# Patient Record
Sex: Male | Born: 1940 | Race: White | Hispanic: No | Marital: Married | Smoking: Never smoker
Health system: Southern US, Community
[De-identification: ages and names within clinical notes are randomized; demographics above are authoritative.]

## PROBLEM LIST (undated history)

## (undated) DIAGNOSIS — K219 Gastro-esophageal reflux disease without esophagitis: Secondary | ICD-10-CM

## (undated) DIAGNOSIS — L111 Transient acantholytic dermatosis [Grover]: Secondary | ICD-10-CM

## (undated) DIAGNOSIS — N35919 Unspecified urethral stricture, male, unspecified site: Secondary | ICD-10-CM

## (undated) DIAGNOSIS — J189 Pneumonia, unspecified organism: Secondary | ICD-10-CM

## (undated) DIAGNOSIS — E785 Hyperlipidemia, unspecified: Secondary | ICD-10-CM

## (undated) DIAGNOSIS — N529 Male erectile dysfunction, unspecified: Secondary | ICD-10-CM

## (undated) HISTORY — PX: CYSTOURETHROSCOPY: SHX476

## (undated) HISTORY — PX: DESTRUCTION LESION PENILE: SUR414

## (undated) HISTORY — PX: CYSTOSCOPY WITH DIRECT VISION INTERNAL URETHROTOMY: SHX6637

## (undated) HISTORY — PX: VASECTOMY: SHX75

---

## 2005-06-10 ENCOUNTER — Ambulatory Visit: Payer: Self-pay | Admitting: Unknown Physician Specialty

## 2005-06-18 ENCOUNTER — Ambulatory Visit: Payer: Self-pay | Admitting: Unknown Physician Specialty

## 2005-07-13 ENCOUNTER — Ambulatory Visit: Payer: Self-pay | Admitting: Unknown Physician Specialty

## 2008-01-24 ENCOUNTER — Ambulatory Visit: Payer: Self-pay | Admitting: Nephrology

## 2009-03-18 IMAGING — US US RENAL KIDNEY
1 series · 14 of 25 positions shown · non-contrast
Comparison: none

REASON FOR EXAM: Hyperkalemia
COMMENTS:

[Series 1: us renal kidney · 0.31mm/px · 14 of 32 slices shown]
[im 1/32]
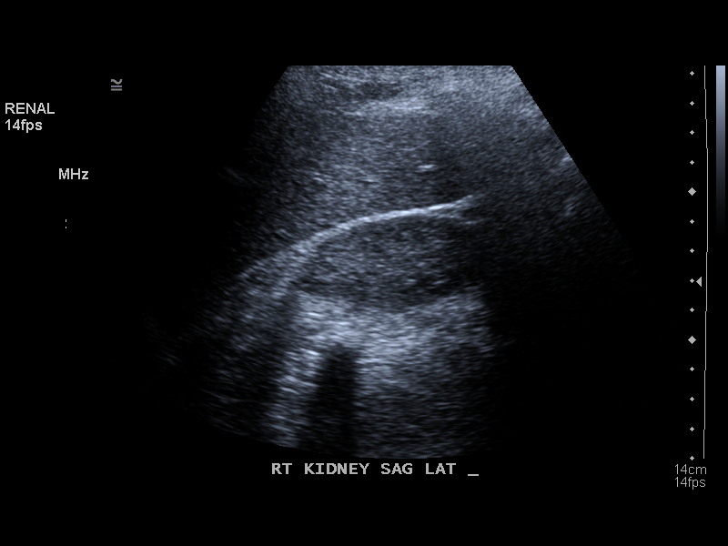
[im 3/32]
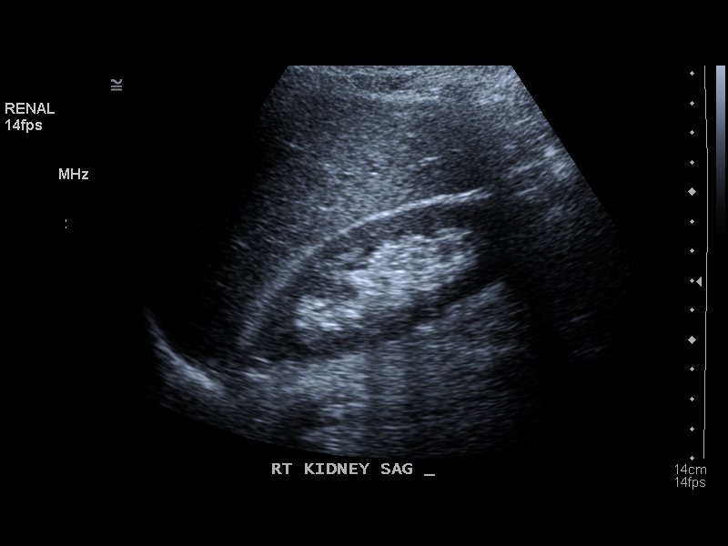
[im 6/32]
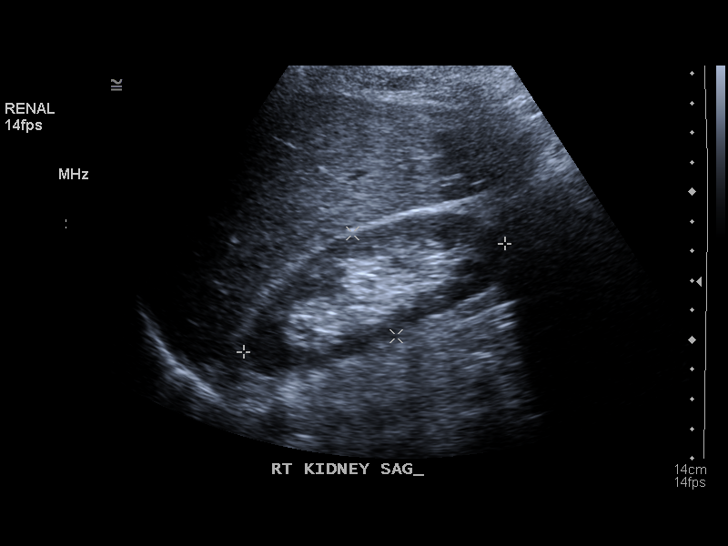
[im 8/32]
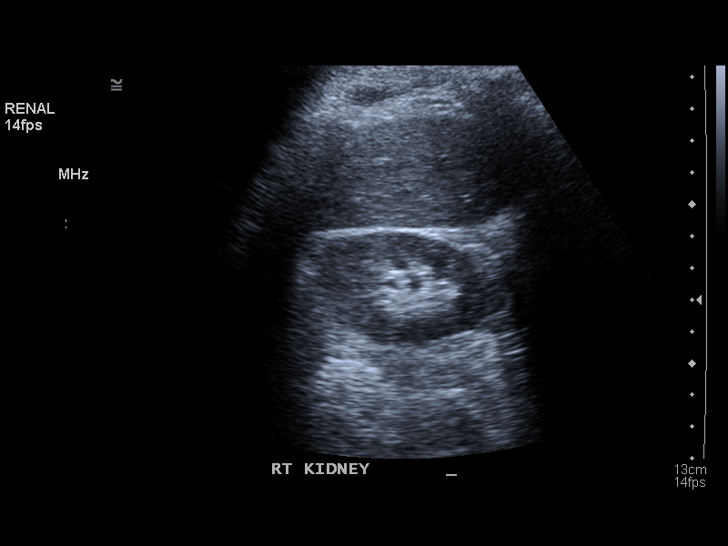
[im 11/32]
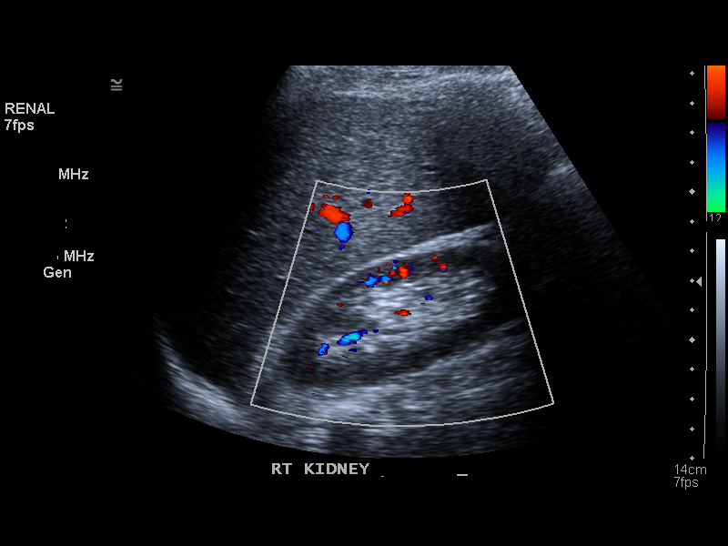
[im 12/32]
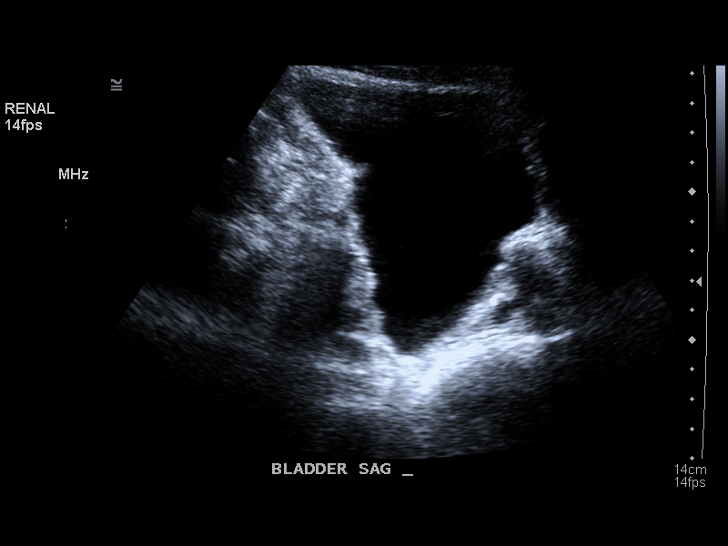
[im 15/32]
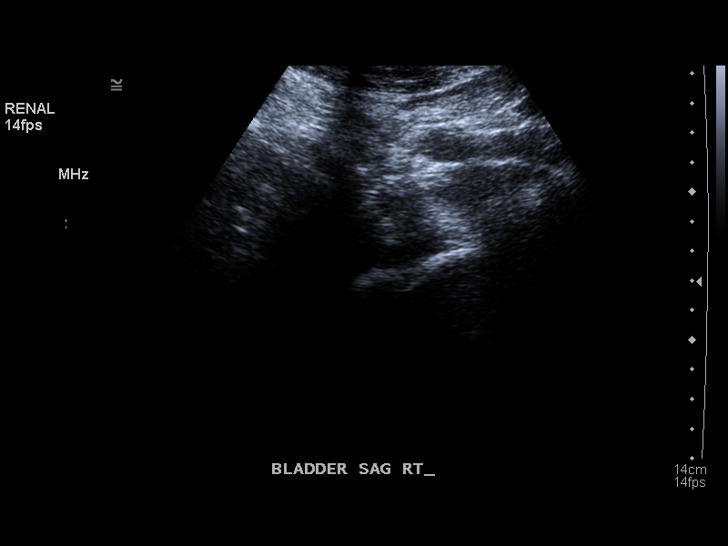
[im 17/32]
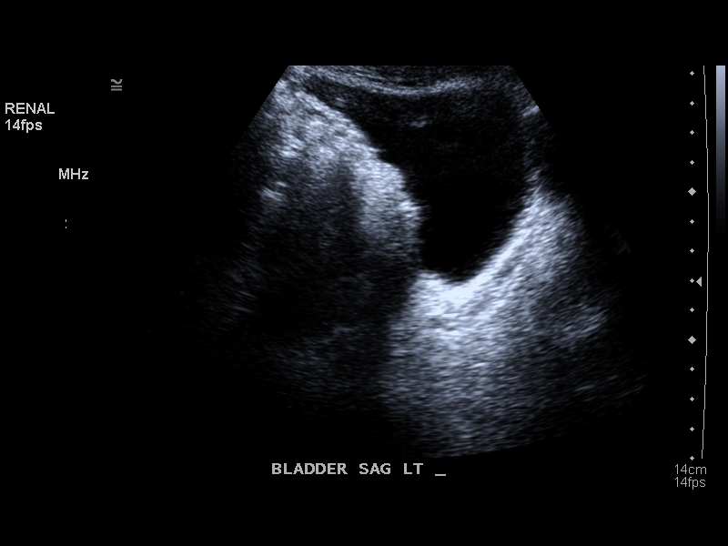
[im 20/32]
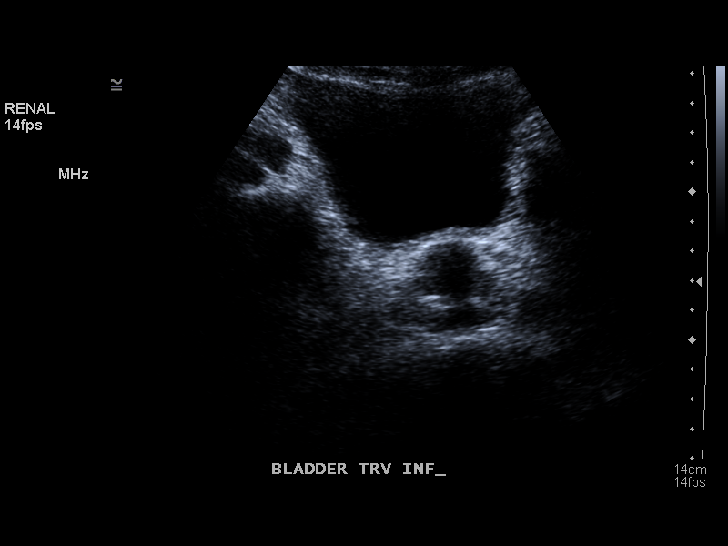
[im 21/32]
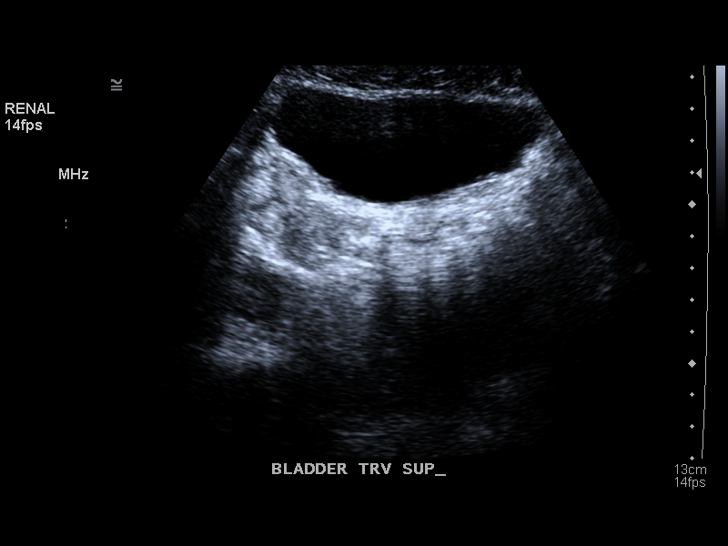
[im 24/32]
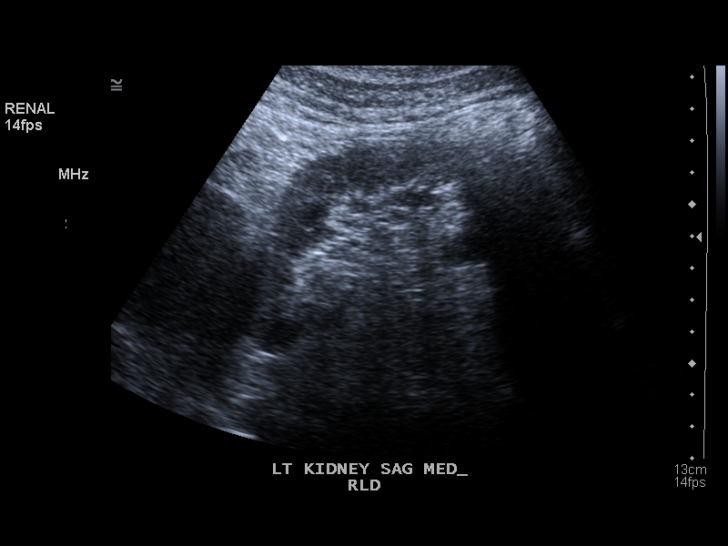
[im 26/32]
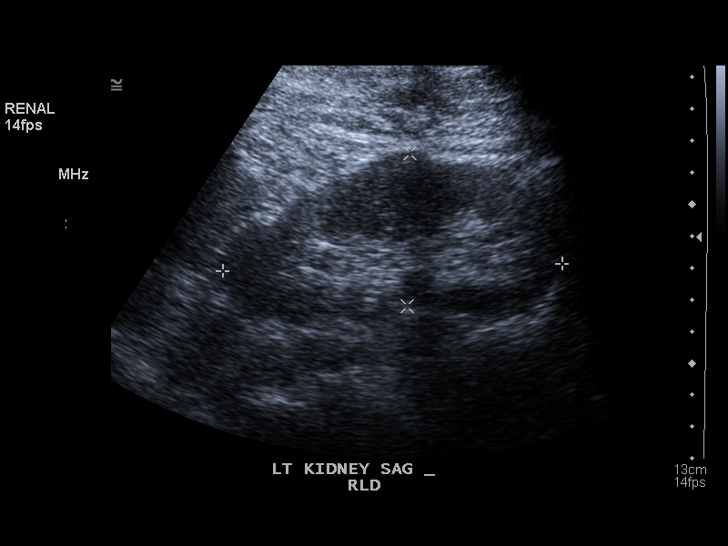
[im 29/32]
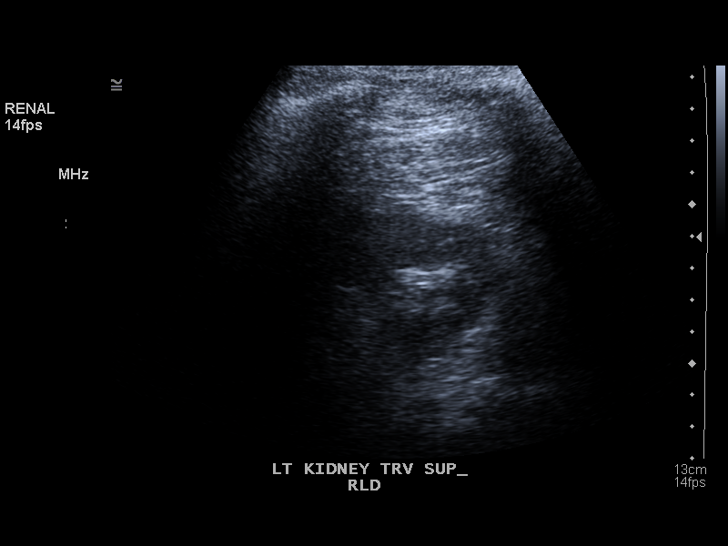
[im 32/32]
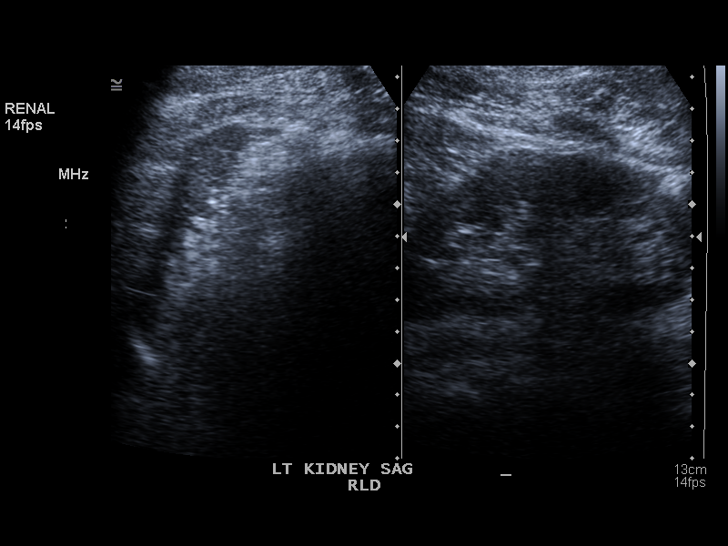

[14 of 25 positions shown; findings below may reference images not displayed]

PROCEDURE:     US  - US KIDNEY  - January 24, 2008  [DATE]

RESULT:     The RIGHT kidney measures 9.6 x 4.2 x 3.4 cm. The LEFT kidney
measures 10.7 x 3.9 x 4.8 cm. There is no hydronephrosis. I see no cystic or
solid mass. No perinephric fluid collection is seen. The urinary bladder is
moderately distended. Along its posterior surface some irregularity of the
mucosa appears present.
IMPRESSION: 1.  I see no acute abnormality of either kidney.
2.  There may be abnormality of the mucosa or submucosa of the posterior
wall of the urinary bladder. Cystoscopy is recommended.

## 2009-12-06 ENCOUNTER — Ambulatory Visit: Payer: Self-pay | Admitting: Urology

## 2011-01-29 IMAGING — US US RENAL KIDNEY
1 series · 17 of 25 positions shown · non-contrast
Comparison: none

REASON FOR EXAM: bladder mass    ATTN: bladder
COMMENTS:

[Series 1: us renal kidney · 17 of 38 slices shown]
[im 1/38]
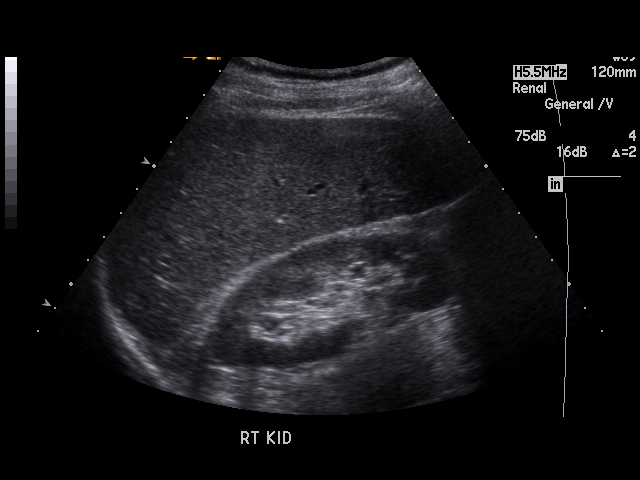
[im 4/38]
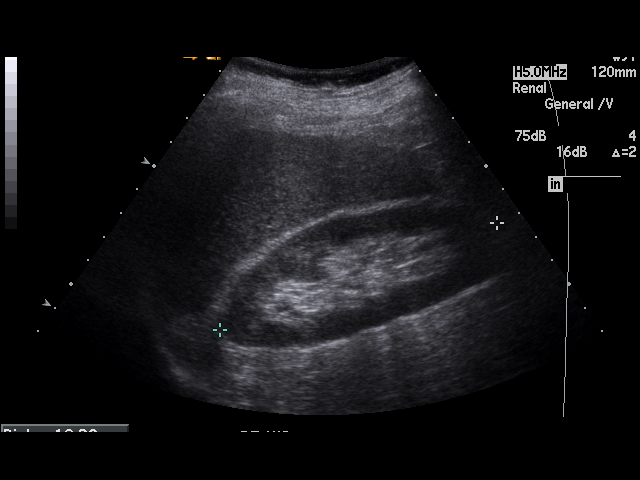
[im 5/38]
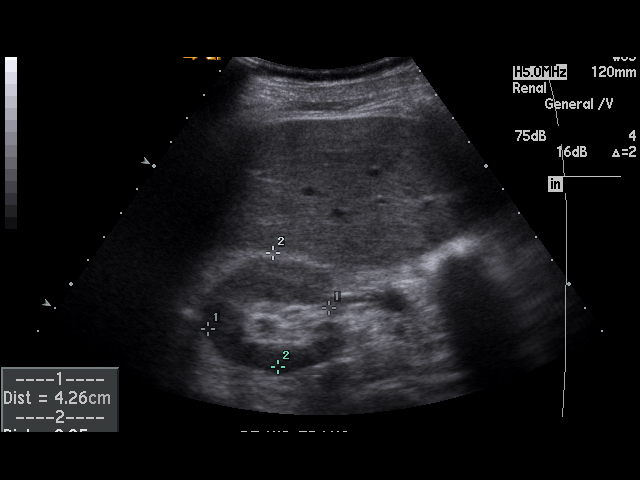
[im 8/38]
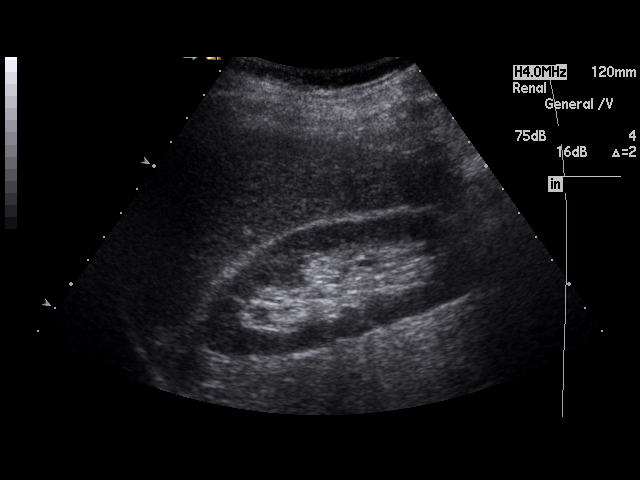
[im 10/38]
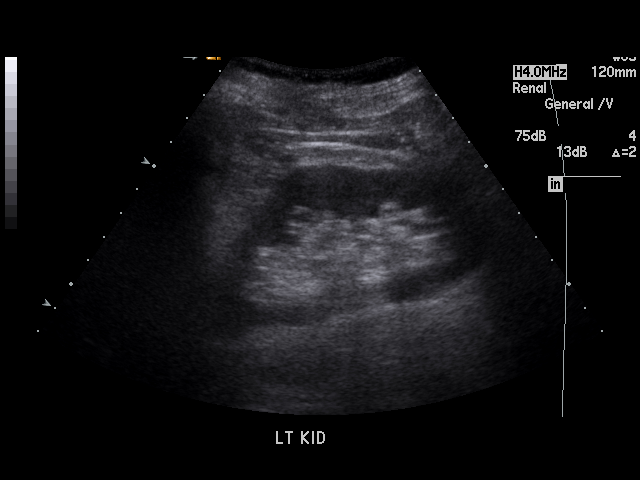
[im 13/38]
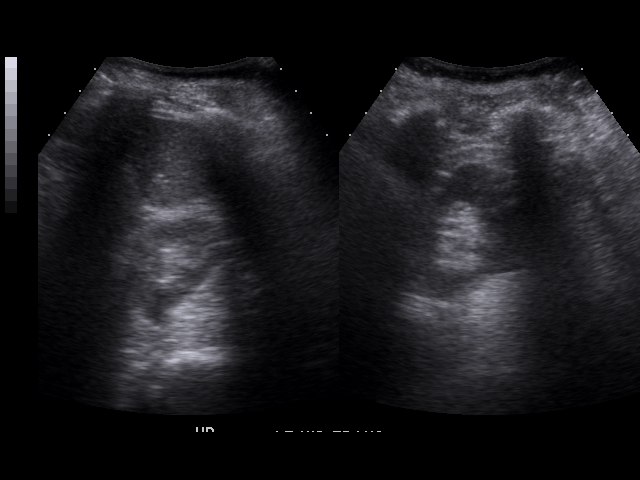
[im 14/38]
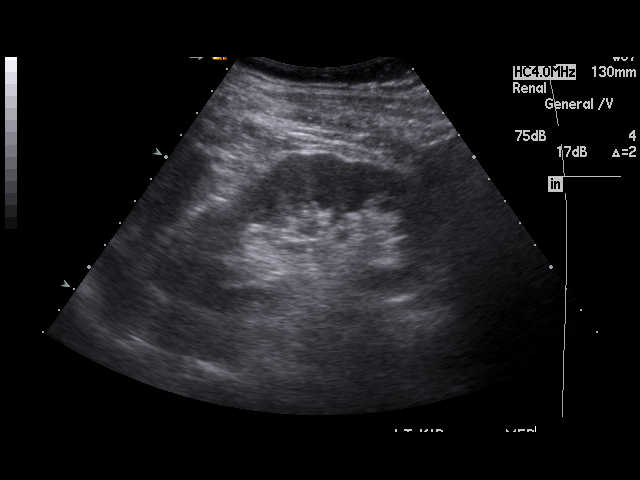
[im 17/38]
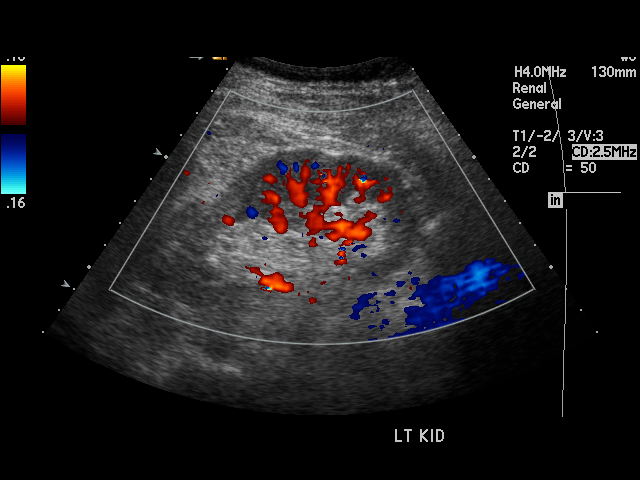
[im 19/38]
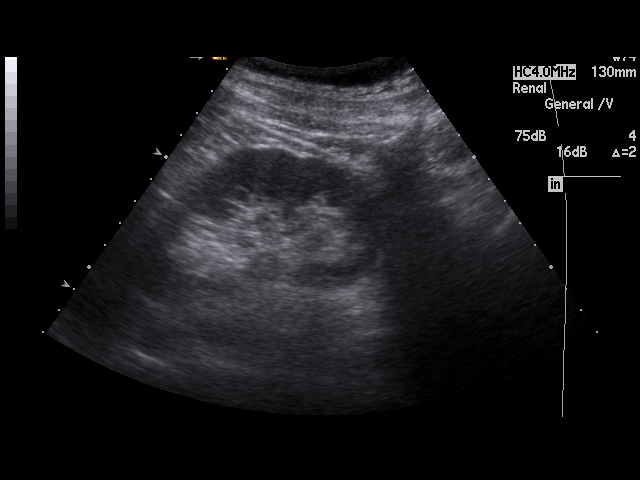
[im 21/38]
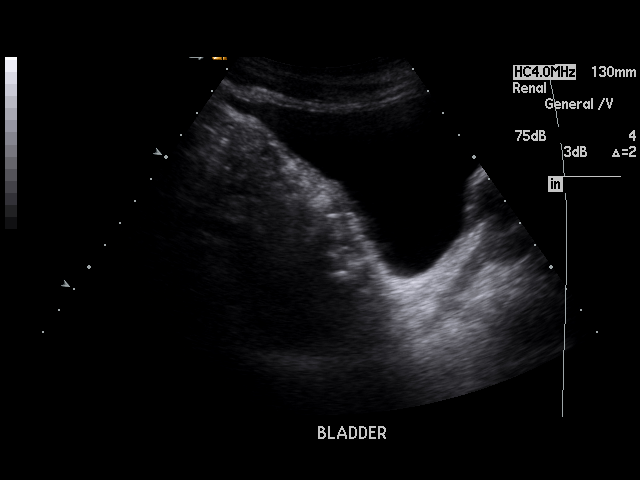
[im 24/38]
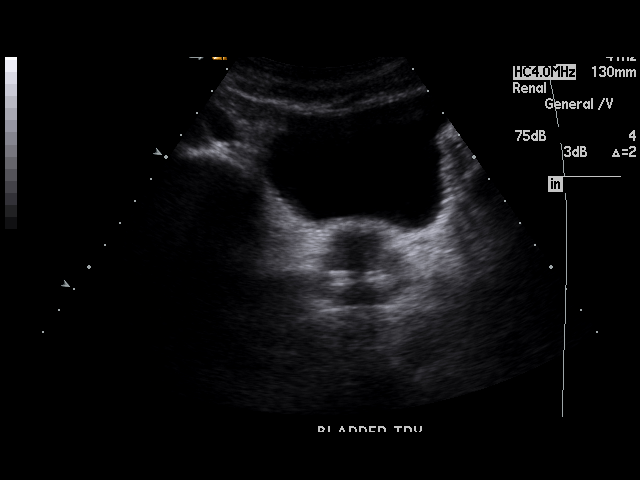
[im 25/38]
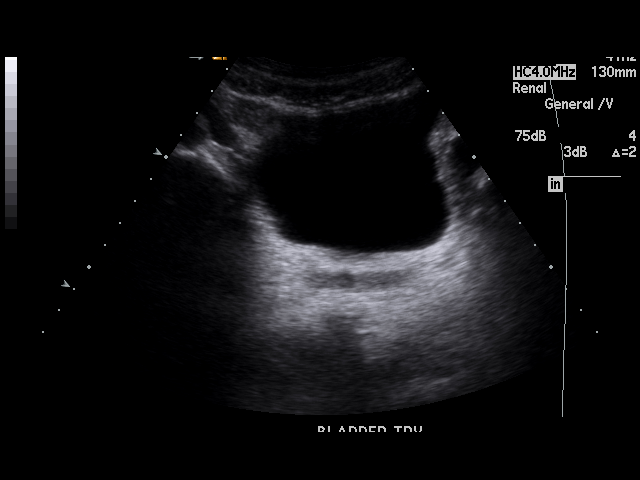
[im 28/38]
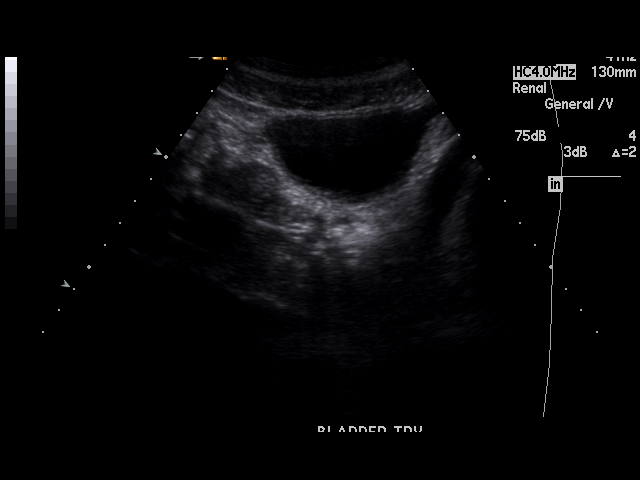
[im 30/38]
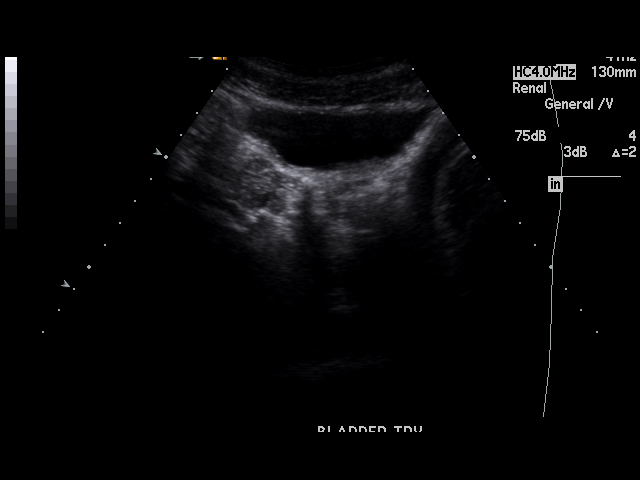
[im 33/38]
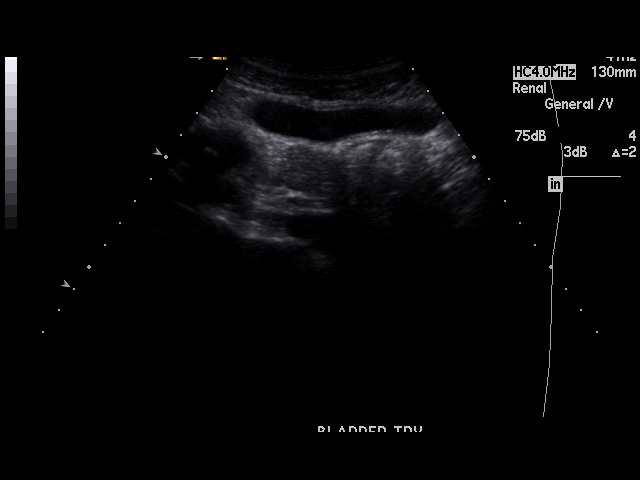
[im 34/38]
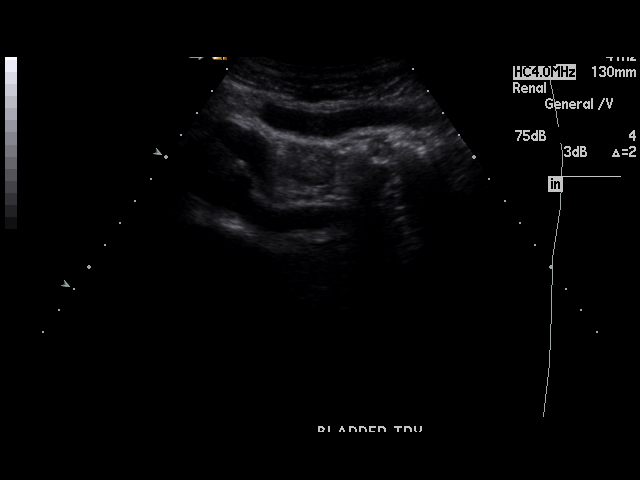
[im 38/38]
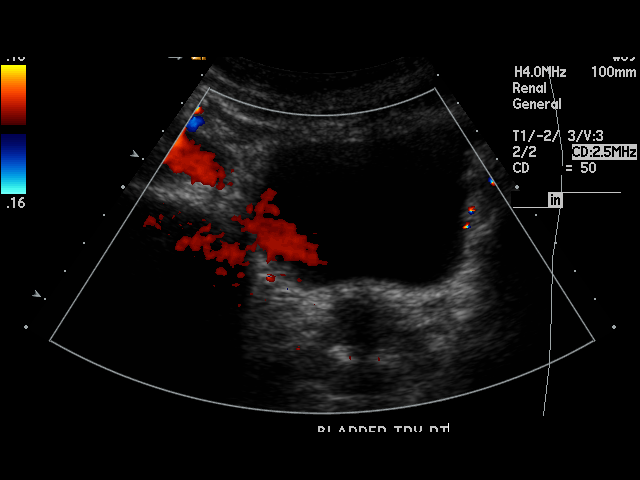

[17 of 25 positions shown; findings below may reference images not displayed]

PROCEDURE:     US  - US KIDNEY  - December 06, 2009  [DATE]

RESULT:     The right kidney measures 10.29 cm x 4.2 cm x 3.95 cm and the
left kidney measures 10.36 cm x 4.3 cm x 4.5 cm. The renal cortical margins
bilaterally are smooth. No renal mass lesions are seen. No renal
calcifications are noted. There is no hydronephrosis. The urinary bladder is
normal in appearance sonographically. The previously noted irregularity of
the posterior bladder wall observed on the exam of 01/24/2008 is no longer
seen. Bilateral ureteral flow jets are present.
IMPRESSION: 1.     No significant abnormalities are noted.

## 2013-01-03 ENCOUNTER — Ambulatory Visit: Payer: Self-pay | Admitting: Urology

## 2013-01-11 ENCOUNTER — Ambulatory Visit: Payer: Self-pay | Admitting: Urology

## 2015-03-22 NOTE — Op Note (Signed)
PATIENT NAME:  Brent Kim, Brent Kim MR#:  [id number] DATE OF BIRTH:  02/26/1941  DATE OF PROCEDURE:  01/11/2013  PREOPERATIVE DIAGNOSIS: Meatal stricture.   POSTOPERATIVE DIAGNOSES:  1. Meatal stricture.  2. Bulbar urethral stricture.   PROCEDURES: 1. Dilation of meatal stricture. 2. Visual internal urethrotomy.  3. Cystoscopy.   SURGEON: Solon Alban C. Lonna CobbStoioff, M.D.   ASSISTANT: None.   ANESTHESIA: General.   INDICATION: This is a 74 year old male with a history of urethral surgery greater than 20 years ago. He has recently noted progressive narrowing and spraying of his voiding stream with mild dysuria. He presents for cystoscopy and further stricture management.   DESCRIPTION OF PROCEDURE: The patient was taken to the operating room where a general anesthetic was administered. He was then placed in the low lithotomy position and his external genitalia were prepped and draped in the usual sterile fashion. The ureteral meatus would not accept a 17 French cystoscope. A pediatric flexible cystoscope was then passed per urethra. Meatal stricture was noted. The scope was advanced to where approximately a 10 French bulbar urethral stricture was noted. The cystoscope was removed. The meatal stricture was dilated with ureteral dilators from 12 to 20 [place]French without difficulty. A 20 French optical urethrotome with obturator was then lubricated and passed under direct vision. The dilated meatal stricture was traversed without problems. The scope was advanced down to the bulbar urethral stricture. The optical urethrotome was placed into the sheath. A guidewire was placed through the stricture and the stricture was incised in the 12 o'clock position. The urethrotome was then easily advanced past the stricture. The urethrotome was removed. A 21 French cystoscope with 30 degree lens was lubricated and passed under direct vision. The incised bulbar stricture was open. Prostate shows mild lateral lobe enlargement.  Bladder was closely inspected with 30 and 70 degree lenses. There was moderate bladder trabeculation present. The ureteral orifices were normal appearing with clear efflux. No bladder mucosal lesions were seen. The cystoscope was removed. A 20 French silicon catheter was placed without difficulty. The patient was taken to the PAC-U in stable condition. There were no complications. EBL minimal.  ____________________________ Verna CzechScott C. Lonna CobbStoioff, MD scs:sb D: 01/11/2013 10:00:48 ET T: 01/11/2013 10:18:01 ET JOB#: [id number]  cc: Lorin PicketScott C. Lonna CobbStoioff, MD, <Dictator> Riki AltesSCOTT C Belkis Norbeck MD ELECTRONICALLY SIGNED 01/18/2013 7:57

## 2015-07-12 ENCOUNTER — Encounter: Payer: Self-pay | Admitting: Endocrinology

## 2015-07-15 ENCOUNTER — Ambulatory Visit
Admission: RE | Admit: 2015-07-15 | Discharge: 2015-07-15 | Disposition: A | Discharge status: Home / Self Care | Payer: Medicare Other | Source: Ambulatory Visit | Attending: Unknown Physician Specialty | Admitting: Unknown Physician Specialty

## 2015-07-15 ENCOUNTER — Ambulatory Visit: Payer: Medicare Other | Admitting: Anesthesiology

## 2015-07-15 ENCOUNTER — Encounter
Admission: RE | Disposition: A | Discharge status: Home / Self Care | Payer: Self-pay | Source: Ambulatory Visit | Attending: Unknown Physician Specialty

## 2015-07-15 ENCOUNTER — Encounter: Payer: Self-pay | Admitting: *Deleted

## 2015-07-15 DIAGNOSIS — Z79899 Other long term (current) drug therapy: Secondary | ICD-10-CM | POA: Diagnosis not present

## 2015-07-15 DIAGNOSIS — Z1211 Encounter for screening for malignant neoplasm of colon: Secondary | ICD-10-CM | POA: Insufficient documentation

## 2015-07-15 DIAGNOSIS — E785 Hyperlipidemia, unspecified: Secondary | ICD-10-CM | POA: Insufficient documentation

## 2015-07-15 DIAGNOSIS — Z7982 Long term (current) use of aspirin: Secondary | ICD-10-CM | POA: Diagnosis not present

## 2015-07-15 DIAGNOSIS — K5730 Diverticulosis of large intestine without perforation or abscess without bleeding: Secondary | ICD-10-CM | POA: Diagnosis not present

## 2015-07-15 DIAGNOSIS — K648 Other hemorrhoids: Secondary | ICD-10-CM | POA: Insufficient documentation

## 2015-07-15 DIAGNOSIS — K219 Gastro-esophageal reflux disease without esophagitis: Secondary | ICD-10-CM | POA: Diagnosis not present

## 2015-07-15 DIAGNOSIS — K621 Rectal polyp: Secondary | ICD-10-CM | POA: Insufficient documentation

## 2015-07-15 HISTORY — DX: Transient acantholytic dermatosis (grover): L11.1

## 2015-07-15 HISTORY — DX: Hyperlipidemia, unspecified: E78.5

## 2015-07-15 HISTORY — DX: Gastro-esophageal reflux disease without esophagitis: K21.9

## 2015-07-15 HISTORY — DX: Male erectile dysfunction, unspecified: N52.9

## 2015-07-15 HISTORY — DX: Unspecified urethral stricture, male, unspecified site: N35.919

## 2015-07-15 HISTORY — DX: Pneumonia, unspecified organism: J18.9

## 2015-07-15 HISTORY — PX: COLONOSCOPY WITH PROPOFOL: SHX5780

## 2015-07-15 SURGERY — COLONOSCOPY WITH PROPOFOL
Anesthesia: General

## 2015-07-15 MED ORDER — GLYCOPYRROLATE 0.2 MG/ML IJ SOLN
INTRAMUSCULAR | Status: DC | PRN
  Administered 2015-07-15: 0.2 mg via INTRAVENOUS

## 2015-07-15 MED ORDER — PROPOFOL 10 MG/ML IV BOLUS
INTRAVENOUS | Status: DC | PRN
  Administered 2015-07-15: 70 mg via INTRAVENOUS

## 2015-07-15 MED ORDER — MIDAZOLAM HCL 2 MG/2ML IJ SOLN
INTRAMUSCULAR | Status: DC | PRN
  Administered 2015-07-15: 2 mg via INTRAVENOUS

## 2015-07-15 MED ORDER — FENTANYL CITRATE (PF) 100 MCG/2ML IJ SOLN
INTRAMUSCULAR | Status: DC | PRN
  Administered 2015-07-15 (×2): 50 ug via INTRAVENOUS

## 2015-07-15 MED ORDER — PROPOFOL INFUSION 10 MG/ML OPTIME
INTRAVENOUS | Status: DC | PRN
  Administered 2015-07-15: 100 ug/kg/min via INTRAVENOUS

## 2015-07-15 MED ORDER — SODIUM CHLORIDE 0.9 % IV SOLN
INTRAVENOUS | Status: DC
  Administered 2015-07-15: 1000 mL via INTRAVENOUS

## 2015-07-15 MED ORDER — SODIUM CHLORIDE 0.9 % IV SOLN: INTRAVENOUS | Status: DC

## 2015-07-15 NOTE — Transfer of Care (Signed)
Immediate Anesthesia Transfer of Care Note  Patient: Brent Kim  Procedure(s) Performed: Procedure(s): COLONOSCOPY WITH PROPOFOL (N/A)  Patient Location: PACU  Anesthesia Type:General  Level of Consciousness: sedated  Airway & Oxygen Therapy: Patient connected to nasal cannula oxygen  Post-op Assessment: Report given to RN  Post vital signs: Reviewed and stable  Last Vitals:  Filed Vitals:   07/15/15 0947  BP: 130/94  Pulse: 48  Temp: 37.1 C  Resp: 16    Complications: No apparent anesthesia complications

## 2015-07-15 NOTE — Anesthesia Postprocedure Evaluation (Signed)
  Anesthesia Post-op Note  Patient: Brent Kim  Procedure(s) Performed: Procedure(s): COLONOSCOPY WITH PROPOFOL (N/A)  Anesthesia type:General  Patient location: PACU  Post pain: Pain level controlled  Post assessment: Post-op Vital signs reviewed, Patient's Cardiovascular Status Stable, Respiratory Function Stable, Patent Airway and No signs of Nausea or vomiting  Post vital signs: Reviewed and stable  Last Vitals:  Filed Vitals:   07/15/15 1050  BP: 95/69  Pulse: 49  Temp:   Resp:     Level of consciousness: awake, alert  and patient cooperative  Complications: No apparent anesthesia complications

## 2015-07-15 NOTE — Anesthesia Preprocedure Evaluation (Signed)
Anesthesia Evaluation  Patient identified by MRN, date of birth, ID band Patient awake    Reviewed: Allergy & Precautions, NPO status , Patient's Chart, lab work & pertinent test results  History of Anesthesia Complications Negative for: history of anesthetic complications  Airway Mallampati: II       Dental no notable dental hx.    Pulmonary neg pulmonary ROS,    Pulmonary exam normal       Cardiovascular negative cardio ROS Normal cardiovascular exam    Neuro/Psych negative neurological ROS  negative psych ROS   GI/Hepatic Neg liver ROS, GERD-  ,  Endo/Other  negative endocrine ROS  Renal/GU negative Renal ROS  negative genitourinary   Musculoskeletal negative musculoskeletal ROS (+)   Abdominal Normal abdominal exam  (+)   Peds negative pediatric ROS (+)  Hematology negative hematology ROS (+)   Anesthesia Other Findings   Reproductive/Obstetrics negative OB ROS                             Anesthesia Physical Anesthesia Plan  ASA: II  Anesthesia Plan: General   Post-op Pain Management:    Induction: Intravenous  Airway Management Planned: Nasal Cannula  Additional Equipment:   Intra-op Plan:   Post-operative Plan:   Informed Consent: I have reviewed the patients History and Physical, chart, labs and discussed the procedure including the risks, benefits and alternatives for the proposed anesthesia with the patient or authorized representative who has indicated his/her understanding and acceptance.     Plan Discussed with: CRNA  Anesthesia Plan Comments:         Anesthesia Quick Evaluation

## 2015-07-15 NOTE — H&P (Signed)
   Primary Care Physician:  Jerl Mina, MD Primary Gastroenterologist:  Dr. Mechele Collin  Pre-Procedure History & Physical: HPI:  Brent Kim is a 74 y.o. male is here for an colonoscopy.   Past Medical History  Diagnosis Date  . Erectile dysfunction   . Pneumonia   . GERD (gastroesophageal reflux disease)   . Hyperlipemia   . Grover's disease   . Urethral stricture     Past Surgical History  Procedure Laterality Date  . Vasectomy    . Cystoscopy with direct vision internal urethrotomy    . Destruction lesion penile    . Cystourethroscopy      Prior to Admission medications   Medication Sig Start Date End Date Taking? Authorizing Provider  aspirin 81 MG tablet Take 81 mg by mouth daily.   Yes Historical Provider, MD  cholecalciferol (VITAMIN D) 1000 UNITS tablet Take 1,000 Units by mouth daily.   Yes Historical Provider, MD  imiquimod (ALDARA) 5 % cream Apply topically 3 (three) times a week.   Yes Historical Provider, MD  Multiple Vitamin (MULTIVITAMIN) tablet Take 1 tablet by mouth daily.   Yes Historical Provider, MD  omeprazole (PRILOSEC) 20 MG capsule Take 20 mg by mouth daily.   Yes Historical Provider, MD  sildenafil (VIAGRA) 50 MG tablet Take 50 mg by mouth daily as needed for erectile dysfunction.   Yes Historical Provider, MD  vitamin B-12 (CYANOCOBALAMIN) 1000 MCG tablet Take 1,000 mcg by mouth daily.   Yes Historical Provider, MD    Allergies as of 06/11/2015  . (Not on File)    History reviewed. No pertinent family history.  Social History   Social History  . Marital Status: Married    Spouse Name: N/A  . Number of Children: N/A  . Years of Education: N/A   Occupational History  . Not on file.   Social History Main Topics  . Smoking status: Never Smoker   . Smokeless tobacco: Never Used  . Alcohol Use: No  . Drug Use: Not on file  . Sexual Activity: Not on file   Other Topics Concern  . Not on file   Social History Narrative     Review of Systems: See HPI, otherwise negative ROS  Physical Exam: BP 130/94 mmHg  Pulse 48  Temp(Src) 98.8 F (37.1 C) (Tympanic)  Resp 16  Ht  (1.753 m)  Wt 68.04 kg (150 lb)  BMI 22.14 kg/m2  SpO2 100% General:   Alert,  pleasant and cooperative in NAD Head:  Normocephalic and atraumatic. Neck:  Supple; no masses or thyromegaly. Lungs:  Clear throughout to auscultation.    Heart:  Regular rate and rhythm. Abdomen:  Soft, nontender and nondistended. Normal bowel sounds, without guarding, and without rebound.   Neurologic:  Alert and  oriented x4;  grossly normal neurologically.  Impression/Plan: Brent Kim is here for an colonoscopy to be performed for screening  Risks, benefits, limitations, and alternatives regarding  colonoscopy have been reviewed with the patient.  Questions have been answered.  All parties agreeable.   Lynnae Prude, MD  07/15/2015, 10:27 AM

## 2015-07-15 NOTE — Op Note (Signed)
[facility] Gastroenterology Patient Name: Brent Kim Procedure Date: 07/15/2015 10:16 AM MRN: [id number] Account #: [id number] Date of Birth: 1941/05/15 Admit Type: Outpatient Age: 74 Room: [facility] ENDO ROOM 1 Gender: Male Note Status: Finalized Procedure:         Colonoscopy Indications:       Screening for colorectal malignant neoplasm Providers:         Scot Jun, MD Referring MD:      Rhona Leavens. Burnett Sheng, MD (Referring MD) Medicines:         Propofol per Anesthesia Complications:     No immediate complications. Procedure:         Pre-Anesthesia Assessment:                    - After reviewing the risks and benefits, the patient was                     deemed in satisfactory condition to undergo the procedure.                    After obtaining informed consent, the colonoscope was                     passed under direct vision. Throughout the procedure, the                     patient's blood pressure, pulse, and oxygen saturations                     were monitored continuously. The Colonoscope was                     introduced through the anus and advanced to the the cecum,                     identified by appendiceal orifice and ileocecal valve. The                     colonoscopy was performed without difficulty. The patient                     tolerated the procedure well. The quality of the bowel                     preparation was excellent. Findings:      A diminutive polyp was found in the rectum. The polyp was sessile. The       polyp was removed with a jumbo cold forceps. Resection and retrieval       were complete.      Multiple medium-mouthed diverticula were found in the sigmoid colon and       in the descending colon.      Internal hemorrhoids were found during endoscopy. The hemorrhoids were       small. Impression:        - One diminutive polyp in the rectum. Resected and                     retrieved.                    -  Diverticulosis in the sigmoid colon and in the                     descending colon.                    -  Internal hemorrhoids. Recommendation:    - Await pathology results. Scot Jun, MD 07/15/2015 10:50:08 AM This report has been signed electronically. Number of Addenda: 0 Note Initiated On: 07/15/2015 10:16 AM Scope Withdrawal Time: 0 hours 9 minutes 33 seconds  Total Procedure Duration: 0 hours 16 minutes 47 seconds       [facility]

## 2015-07-16 ENCOUNTER — Encounter: Payer: Self-pay | Admitting: Unknown Physician Specialty

## 2015-07-16 LAB — SURGICAL PATHOLOGY

## 2017-04-11 ENCOUNTER — Emergency Department
Admission: EM | Admit: 2017-04-11 | Discharge: 2017-04-11 | Disposition: A | Discharge status: Home / Self Care | Payer: Medicare Other | Attending: Emergency Medicine | Admitting: Emergency Medicine

## 2017-04-11 ENCOUNTER — Emergency Department: Payer: Medicare Other

## 2017-04-11 DIAGNOSIS — E871 Hypo-osmolality and hyponatremia: Secondary | ICD-10-CM

## 2017-04-11 DIAGNOSIS — R51 Headache: Secondary | ICD-10-CM | POA: Diagnosis not present

## 2017-04-11 DIAGNOSIS — Z7982 Long term (current) use of aspirin: Secondary | ICD-10-CM | POA: Insufficient documentation

## 2017-04-11 DIAGNOSIS — R4182 Altered mental status, unspecified: Secondary | ICD-10-CM | POA: Diagnosis present

## 2017-04-11 DIAGNOSIS — E860 Dehydration: Secondary | ICD-10-CM

## 2017-04-11 LAB — COMPREHENSIVE METABOLIC PANEL
ALT: 20 U/L (ref 17–63)
AST: 28 U/L (ref 15–41)
Albumin: 3.9 g/dL (ref 3.5–5.0)
Alkaline Phosphatase: 41 U/L (ref 38–126)
Anion gap: 8 (ref 5–15)
BUN: 13 mg/dL (ref 6–20)
CHLORIDE: 92 mmol/L — AB (ref 101–111)
CO2: 26 mmol/L (ref 22–32)
CREATININE: 0.92 mg/dL (ref 0.61–1.24)
Calcium: 8.7 mg/dL — ABNORMAL LOW (ref 8.9–10.3)
GFR calc non Af Amer: >60 mL/min (ref >60)
Glucose, Bld: 107 mg/dL — ABNORMAL HIGH (ref 65–99)
Potassium: 4 mmol/L (ref 3.5–5.1)
SODIUM: 126 mmol/L — AB (ref 135–145)
Total Bilirubin: 0.8 mg/dL (ref 0.3–1.2)
Total Protein: 6.6 g/dL (ref 6.5–8.1)

## 2017-04-11 LAB — CBC
HCT: 38.1 % — ABNORMAL LOW (ref 40.0–52.0)
HEMOGLOBIN: 13.2 g/dL (ref 13.0–18.0)
MCH: 33.6 pg (ref 26.0–34.0)
MCHC: 34.7 g/dL (ref 32.0–36.0)
MCV: 97 fL (ref 80.0–100.0)
PLATELETS: 256 10*3/uL (ref 150–440)
RBC: 3.93 MIL/uL — AB (ref 4.40–5.90)
RDW: 12.2 % (ref 11.5–14.5)
WBC: 5.1 10*3/uL (ref 3.8–10.6)

## 2017-04-11 MED ORDER — SODIUM CHLORIDE 0.9 % IV BOLUS (SEPSIS)
1000.0000 mL | Freq: Once | INTRAVENOUS | Status: AC
  Administered 2017-04-11: 1000 mL via INTRAVENOUS

## 2017-04-11 NOTE — ED Triage Notes (Signed)
Pt states that he is generally very healthy, states that on Friday evening he started to develop some confusion in the evening after spending a large portion of the day out in the heat working in the yard, pt states Saturday in the morning he felt better but then started to develop a headache as well as the confusion again, pt states that earlier today he felt better and then again this evening started feeling confused and having a bad headache again, pt denies removing any ticks recently or traveling out of the country

## 2017-04-11 NOTE — ED Provider Notes (Signed)
[facility] Emergency Department Provider Note  ____________________________________________  Time seen: Approximately 9:50 PM  I have reviewed the triage vital signs and the nursing notes.   HISTORY  Chief Complaint Altered Mental Status    HPI Brent Kim is a 76 y.o. male who complains of a few discrete episodes of word finding difficulty and memory impairment over the last 2 days. Also has been having bilateral frontal headache for the last 24 hours it was gradual in onset, mild, not associated with numbness tingling or weakness. No vision changes. No syncope.  Reports it is been doing a lot of outdoor activities over last several days. Ambient temperatures have been in the 90s. He's been drinking a lot of water and eating normally but does feel very tired. No gait instability or change in balance and coordination.     Past Medical History:  Diagnosis Date  . Erectile dysfunction   . GERD (gastroesophageal reflux disease)   . Grover's disease   . Hyperlipemia   . Pneumonia   . Urethral stricture      There are no active problems to display for this patient.    Past Surgical History:  Procedure Laterality Date  . COLONOSCOPY WITH PROPOFOL N/A 07/15/2015   Procedure: COLONOSCOPY WITH PROPOFOL;  Surgeon: Scot Jun, MD;  Location: [facility] ENDOSCOPY;  Service: Endoscopy;  Laterality: N/A;  . CYSTOSCOPY WITH DIRECT VISION INTERNAL URETHROTOMY    . CYSTOURETHROSCOPY    . DESTRUCTION LESION PENILE    . VASECTOMY       Prior to Admission medications   Medication Sig Start Date End Date Taking? Authorizing Provider  aspirin 81 MG tablet Take 81 mg by mouth daily.    [provider]  cholecalciferol (VITAMIN D) 1000 UNITS tablet Take 1,000 Units by mouth daily.    [provider]  imiquimod (ALDARA) 5 % cream Apply topically 3 (three) times a week.    [provider]  Multiple Vitamin (MULTIVITAMIN) tablet Take  1 tablet by mouth daily.    [provider]  omeprazole (PRILOSEC) 20 MG capsule Take 20 mg by mouth daily.    [provider]  sildenafil (VIAGRA) 50 MG tablet Take 50 mg by mouth daily as needed for erectile dysfunction.    [provider]  vitamin B-12 (CYANOCOBALAMIN) 1000 MCG tablet Take 1,000 mcg by mouth daily.    [provider]     Allergies Patient has no known allergies.   No family history on file.  Social History Social History  Substance Use Topics  . Smoking status: Never Smoker  . Smokeless tobacco: Never Used  . Alcohol use No    Review of Systems  Constitutional:   No fever or chills.  ENT:   No sore throat. No rhinorrhea. Lymphatic: No swollen glands, No extremity swelling Endocrine: No hot/cold flashes. No significant weight change. No neck swelling. Cardiovascular:   No chest pain or syncope. Respiratory:   No dyspnea or cough. Gastrointestinal:   Negative for abdominal pain, vomiting and diarrhea.  Genitourinary:   Negative for dysuria or difficulty urinating. Musculoskeletal:   Negative for focal pain or swelling Neurological:   Positive bilateral frontal headache. All other systems reviewed and are negative except as documented above in ROS and HPI.  ____________________________________________   PHYSICAL EXAM:  VITAL SIGNS: ED Triage Vitals  Enc Vitals Group     BP 04/11/17 1950 (!) 160/79     Pulse Rate 04/11/17 1950 (!) 50  Resp 04/11/17 1950 18     Temp 04/11/17 1950 98.4 F (36.9 C)     Temp Source 04/11/17 1950 Oral     SpO2 04/11/17 1950 99 %     Weight 04/11/17 1950 150 lb (68 kg)     Height 04/11/17 1950 5\' 9"  (1.753 m)     Head Circumference --      Peak Flow --      Pain Score 04/11/17 2107 7     Pain Loc --      Pain Edu? --      Excl. in GC? --     Vital signs reviewed, nursing assessments reviewed.   Constitutional:   Alert and oriented. Well appearing and in no distress. Eyes:    No scleral icterus. No conjunctival pallor. PERRL. EOMI.  No nystagmus. ENT   Head:   Normocephalic and atraumatic.   Nose:   No congestion/rhinnorhea. No septal hematoma   Mouth/Throat:   MMM, no pharyngeal erythema. No peritonsillar mass.    Neck:   No stridor. No SubQ emphysema. No meningismus. Hematological/Lymphatic/Immunilogical:   No cervical lymphadenopathy. Cardiovascular:   RRR. Symmetric bilateral radial and DP pulses.  No murmurs.  Respiratory:   Normal respiratory effort without tachypnea nor retractions. Breath sounds are clear and equal bilaterally. No wheezes/rales/rhonchi. Gastrointestinal:   Soft and nontender. Non distended. There is no CVA tenderness.  No rebound, rigidity, or guarding. Genitourinary:   deferred Musculoskeletal:   Normal range of motion in all extremities. No joint effusions.  No lower extremity tenderness.  No edema. Neurologic:   Normal speech and language.  CN 2-10 normal. Motor grossly intact. No gross focal neurologic deficits are appreciated.  Skin:    Skin is warm, dry and intact. No rash noted.  No petechiae, purpura, or bullae.  ____________________________________________    LABS (pertinent positives/negatives) (all labs ordered are listed, but only abnormal results are displayed) Labs Reviewed  COMPREHENSIVE METABOLIC PANEL - Abnormal; Notable for the following:       Result Value   Sodium 126 (*)    Chloride 92 (*)    Glucose, Bld 107 (*)    Calcium 8.7 (*)    All other components within normal limits  CBC - Abnormal; Notable for the following:    RBC 3.93 (*)    HCT 38.1 (*)    All other components within normal limits  CBG MONITORING, ED   ____________________________________________   EKG  Interpreted by me Sinus bradycardia rate of 57, left axis, normal intervals. Poor R-wave progression in anterior precordial leads. Normal ST segments and T waves.  ____________________________________________     RADIOLOGY  Ct Head Wo Contrast  Result Date: 04/11/2017 CLINICAL DATA:  Headache and confusion EXAM: CT HEAD WITHOUT CONTRAST TECHNIQUE: Contiguous axial images were obtained from the base of the skull through the vertex without intravenous contrast. COMPARISON:  None. FINDINGS: BRAIN: There is mild sulcal and ventricular prominence consistent with superficial and central atrophy. No intraparenchymal hemorrhage, mass effect nor midline shift. Periventricular and subcortical white matter hypodensities consistent with chronic small vessel ischemic disease are identified. Idiopathic bilateral basal ganglial calcifications are seen. No acute large vascular territory infarcts. No abnormal extra-axial fluid collections. Basal cisterns are not effaced and midline. VASCULAR: Moderate calcific atherosclerosis of the carotid siphons and both vertebral arteries. SKULL: No skull fracture. No significant scalp soft tissue swelling. SINUSES/ORBITS: The mastoid air-cells are clear. Sinus atelectasis with mucosal opacification of the right maxillary sinus is noted likely  reflecting chronic right maxillary sinusitis.The included ocular globes and orbital contents are non-suspicious. OTHER: None. 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 Electronically Signed   By: Tollie Eth M.D.   On: 04/11/2017 20:56    ____________________________________________   PROCEDURES Procedures  ____________________________________________   INITIAL IMPRESSION / ASSESSMENT AND PLAN / ED COURSE  Pertinent labs & imaging results that were available during my care of the patient were reviewed by me and considered in my medical decision making (see chart for details).  Patient well appearing no acute distress. Presents with frontal headache and fatigue. Labs do reveal a sodium level of 126.  Review of electronic medical records shows this is a new issue for him. Overall his neurologic symptoms are very minor. I suspect with IV saline hydration he will improve and be suitable for discharge home. In the case he'll follow up with his primary care doctor within a week for blood pressure recheck and serum sodium monitoring. No concerning medical history. No new medications. No evidence of infection. We'll reassess symptoms after IV fluid bolus.    ----------------------------------------- 11:16 PM on 04/11/2017 -----------------------------------------  Patient feels great. Vital signs stable. Ambulatory with steady gait. Headache improved. We'll discharge home, follow-up with primary care this week. Will increase salt intake in diet.       ____________________________________________   FINAL CLINICAL IMPRESSION(S) / ED DIAGNOSES  Final diagnoses:  Hyponatremia      New Prescriptions   No medications on file     Portions of this note were generated with dragon dictation software. Dictation errors may occur despite best attempts at proofreading.    Sharman Cheek, MD 04/11/17 [phone]

## 2019-06-02 ENCOUNTER — Observation Stay
Admission: EM | Admit: 2019-06-02 | Discharge: 2019-06-03 | Disposition: A | Discharge status: Home / Self Care | Payer: Medicare Other | Attending: Internal Medicine | Admitting: Internal Medicine

## 2019-06-02 ENCOUNTER — Emergency Department: Payer: Medicare Other

## 2019-06-02 ENCOUNTER — Other Ambulatory Visit: Payer: Self-pay

## 2019-06-02 ENCOUNTER — Encounter: Payer: Self-pay | Admitting: Emergency Medicine

## 2019-06-02 DIAGNOSIS — Z7982 Long term (current) use of aspirin: Secondary | ICD-10-CM | POA: Diagnosis not present

## 2019-06-02 DIAGNOSIS — Z79899 Other long term (current) drug therapy: Secondary | ICD-10-CM | POA: Diagnosis not present

## 2019-06-02 DIAGNOSIS — R001 Bradycardia, unspecified: Secondary | ICD-10-CM | POA: Insufficient documentation

## 2019-06-02 DIAGNOSIS — E785 Hyperlipidemia, unspecified: Secondary | ICD-10-CM | POA: Diagnosis not present

## 2019-06-02 DIAGNOSIS — R55 Syncope and collapse: Secondary | ICD-10-CM | POA: Diagnosis present

## 2019-06-02 DIAGNOSIS — Z20828 Contact with and (suspected) exposure to other viral communicable diseases: Secondary | ICD-10-CM | POA: Diagnosis not present

## 2019-06-02 DIAGNOSIS — X58XXXA Exposure to other specified factors, initial encounter: Secondary | ICD-10-CM | POA: Insufficient documentation

## 2019-06-02 DIAGNOSIS — J309 Allergic rhinitis, unspecified: Secondary | ICD-10-CM | POA: Diagnosis not present

## 2019-06-02 DIAGNOSIS — K219 Gastro-esophageal reflux disease without esophagitis: Secondary | ICD-10-CM | POA: Diagnosis not present

## 2019-06-02 DIAGNOSIS — T675XXA Heat exhaustion, unspecified, initial encounter: Secondary | ICD-10-CM | POA: Diagnosis not present

## 2019-06-02 LAB — URINALYSIS, COMPLETE (UACMP) WITH MICROSCOPIC
Bilirubin Urine: NEGATIVE
Glucose, UA: NEGATIVE mg/dL
Hgb urine dipstick: NEGATIVE
Ketones, ur: 5 mg/dL — AB
Leukocytes,Ua: NEGATIVE
Nitrite: NEGATIVE
Protein, ur: NEGATIVE mg/dL
Specific Gravity, Urine: 1.01 (ref 1.005–1.030)
pH: 6 (ref 5.0–8.0)

## 2019-06-02 LAB — CBC WITH DIFFERENTIAL/PLATELET
Abs Immature Granulocytes: 0.05 10*3/uL (ref 0.00–0.07)
Basophils Absolute: 0 10*3/uL (ref 0.0–0.1)
Basophils Relative: 1 %
Eosinophils Absolute: 0.5 10*3/uL (ref 0.0–0.5)
Eosinophils Relative: 6 %
HCT: 41.7 % (ref 39.0–52.0)
Hemoglobin: 14.4 g/dL (ref 13.0–17.0)
Immature Granulocytes: 1 %
Lymphocytes Relative: 17 %
Lymphs Abs: 1.3 10*3/uL (ref 0.7–4.0)
MCH: 32.8 pg (ref 26.0–34.0)
MCHC: 34.5 g/dL (ref 30.0–36.0)
MCV: 95 fL (ref 80.0–100.0)
Monocytes Absolute: 0.9 10*3/uL (ref 0.1–1.0)
Monocytes Relative: 11 %
Neutro Abs: 5.1 10*3/uL (ref 1.7–7.7)
Neutrophils Relative %: 64 %
Platelets: 320 10*3/uL (ref 150–400)
RBC: 4.39 MIL/uL (ref 4.22–5.81)
RDW: 11.8 % (ref 11.5–15.5)
WBC: 7.8 10*3/uL (ref 4.0–10.5)
nRBC: 0 % (ref 0.0–0.2)

## 2019-06-02 LAB — COMPREHENSIVE METABOLIC PANEL
ALT: 19 U/L (ref 0–44)
AST: 29 U/L (ref 15–41)
Albumin: 3.6 g/dL (ref 3.5–5.0)
Alkaline Phosphatase: 37 U/L — ABNORMAL LOW (ref 38–126)
Anion gap: 12 (ref 5–15)
BUN: 17 mg/dL (ref 8–23)
CO2: 21 mmol/L — ABNORMAL LOW (ref 22–32)
Calcium: 8.8 mg/dL — ABNORMAL LOW (ref 8.9–10.3)
Chloride: 100 mmol/L (ref 98–111)
Creatinine, Ser: 1.2 mg/dL (ref 0.61–1.24)
GFR calc Af Amer: >60 mL/min (ref >60)
GFR calc non Af Amer: 58 mL/min — ABNORMAL LOW (ref >60)
Glucose, Bld: 142 mg/dL — ABNORMAL HIGH (ref 70–99)
Potassium: 4.2 mmol/L (ref 3.5–5.1)
Sodium: 133 mmol/L — ABNORMAL LOW (ref 135–145)
Total Bilirubin: 0.8 mg/dL (ref 0.3–1.2)
Total Protein: 6.6 g/dL (ref 6.5–8.1)

## 2019-06-02 LAB — SARS CORONAVIRUS 2 BY RT PCR (HOSPITAL ORDER, PERFORMED IN ~~LOC~~ HOSPITAL LAB): SARS Coronavirus 2: NEGATIVE

## 2019-06-02 LAB — TROPONIN I (HIGH SENSITIVITY)
Troponin I (High Sensitivity): 7 ng/L (ref <18)
Troponin I (High Sensitivity): 7 ng/L (ref <18)
Troponin I (High Sensitivity): 9 ng/L (ref <18)
Troponin I (High Sensitivity): 7 ng/L (ref <18)

## 2019-06-02 LAB — ETHANOL: Alcohol, Ethyl (B): <10 mg/dL (ref <10)

## 2019-06-02 LAB — CK: Total CK: 93 U/L (ref 49–397)

## 2019-06-02 MED ORDER — SODIUM CHLORIDE 0.9 % IV BOLUS
1000.0000 mL | Freq: Once | INTRAVENOUS | Status: AC
  Administered 2019-06-02: 1000 mL via INTRAVENOUS

## 2019-06-02 MED ORDER — PANTOPRAZOLE SODIUM 40 MG PO TBEC
40.0000 mg | DELAYED_RELEASE_TABLET | Freq: Every day | ORAL | Status: DC
  Administered 2019-06-03: 11:00:00 40 mg via ORAL
  Filled 2019-06-02: qty 1

## 2019-06-02 MED ORDER — ACETAMINOPHEN 650 MG RE SUPP: 650.0000 mg | Freq: Four times a day (QID) | RECTAL | Status: DC | PRN

## 2019-06-02 MED ORDER — ENOXAPARIN SODIUM 40 MG/0.4ML ~~LOC~~ SOLN
40.0000 mg | SUBCUTANEOUS | Status: DC
  Administered 2019-06-02: 40 mg via SUBCUTANEOUS
  Filled 2019-06-02: qty 0.4

## 2019-06-02 MED ORDER — ONDANSETRON HCL 4 MG/2ML IJ SOLN: 4.0000 mg | Freq: Four times a day (QID) | INTRAMUSCULAR | Status: DC | PRN

## 2019-06-02 MED ORDER — ONDANSETRON HCL 4 MG PO TABS: 4.0000 mg | ORAL_TABLET | Freq: Four times a day (QID) | ORAL | Status: DC | PRN

## 2019-06-02 MED ORDER — TRAZODONE HCL 50 MG PO TABS
50.0000 mg | ORAL_TABLET | Freq: Every day | ORAL | Status: DC
  Administered 2019-06-02: 50 mg via ORAL
  Filled 2019-06-02: qty 1

## 2019-06-02 MED ORDER — ASPIRIN EC 81 MG PO TBEC
81.0000 mg | DELAYED_RELEASE_TABLET | Freq: Every day | ORAL | Status: DC
  Administered 2019-06-03: 81 mg via ORAL
  Filled 2019-06-02: qty 1

## 2019-06-02 MED ORDER — LORATADINE 10 MG PO TABS
10.0000 mg | ORAL_TABLET | Freq: Every day | ORAL | Status: DC
  Administered 2019-06-03: 10 mg via ORAL
  Filled 2019-06-02: qty 1

## 2019-06-02 MED ORDER — ACETAMINOPHEN 325 MG PO TABS
650.0000 mg | ORAL_TABLET | Freq: Four times a day (QID) | ORAL | Status: DC | PRN
  Administered 2019-06-02: 650 mg via ORAL
  Filled 2019-06-02: qty 2

## 2019-06-02 MED ORDER — ADULT MULTIVITAMIN W/MINERALS CH
1.0000 | ORAL_TABLET | Freq: Every day | ORAL | Status: DC
  Administered 2019-06-03: 1 via ORAL
  Filled 2019-06-02: qty 1

## 2019-06-02 MED ORDER — VITAMIN D3 25 MCG (1000 UNIT) PO TABS
1000.0000 [IU] | ORAL_TABLET | Freq: Every day | ORAL | Status: DC
  Administered 2019-06-03: 1000 [IU] via ORAL
  Filled 2019-06-02 (×2): qty 1

## 2019-06-02 MED ORDER — SODIUM CHLORIDE 0.9 % IV BOLUS
500.0000 mL | Freq: Once | INTRAVENOUS | Status: AC
  Administered 2019-06-02: 500 mL via INTRAVENOUS

## 2019-06-02 NOTE — Progress Notes (Signed)
Pt refusing bed alarm. Pt educated on the reasoning for the bed alarm and his syncopal episode today. Will continue to monitor and assess.

## 2019-06-02 NOTE — H&P (Signed)
[employer] - [place] [facility]    PATIENT NAME: Brent Kim    MR#:  [id number]  DATE OF BIRTH:  July 30, 1941  DATE OF ADMISSION:  06/02/2019  PRIMARY CARE PHYSICIAN: Jerl MinaHedrick, James, MD   REQUESTING/REFERRING PHYSICIAN: Dr. Ileana RoupJames McShane  CHIEF COMPLAINT:   Chief Complaint  Patient presents with  . Loss of Consciousness    HISTORY OF PRESENT ILLNESS:  Brent Kim  is a 78 y.o. male with a known history of GERD, hyper lipidemia, urethral stricture, erectile dysfunction who presents to the hospital after a syncopal episode.  Patient says he was outside working on a bird house and apparently had a syncopal episode.  He does not recall the events but his wife found him unresponsive in the backyard and therefore called EMS and therefore he was brought to the hospital.  Patient was not noted to be hypothermic or hypotensive.  Some dizziness prior to him having a syncopal episode but no other associated symptoms.  Denies any chest pains, palpitations, nausea, vomiting, abdominal pain, headache or any other associated symptoms.  In the emergency room patient CT head was negative his vital signs are stable he is EKG showed some sinus arrhythmia and therefore hospitalist services were contacted for admission under observation.  Patient has no previous cardiac history.  PAST MEDICAL HISTORY:   Past Medical History:  Diagnosis Date  . Erectile dysfunction   . GERD (gastroesophageal reflux disease)   . Grover's disease   . Hyperlipemia   . Pneumonia   . Urethral stricture     PAST SURGICAL HISTORY:   Past Surgical History:  Procedure Laterality Date  . COLONOSCOPY WITH PROPOFOL N/A 07/15/2015   Procedure: COLONOSCOPY WITH PROPOFOL;  Surgeon: Scot Junobert T Elliott, MD;  Location: [facility] ENDOSCOPY;  Service: Endoscopy;  Laterality: N/A;  . CYSTOSCOPY WITH DIRECT VISION INTERNAL URETHROTOMY    . CYSTOURETHROSCOPY    . DESTRUCTION LESION PENILE    . VASECTOMY       SOCIAL HISTORY:   Social History   Tobacco Use  . Smoking status: Never Smoker  . Smokeless tobacco: Never Used  Substance Use Topics  . Alcohol use: Yes    Comment: 1 burboun, 1 glass of wine    FAMILY HISTORY:   Family History  Problem Relation Age of Onset  . Kidney cancer Mother   . Lung cancer Mother   . Stroke Father     DRUG ALLERGIES:  No Known Allergies  REVIEW OF SYSTEMS:   Review of Systems  Constitutional: Negative for fever and weight loss.  HENT: Negative for congestion, nosebleeds and tinnitus.   Eyes: Negative for blurred vision, double vision and redness.  Respiratory: Negative for cough, hemoptysis and shortness of breath.   Cardiovascular: Negative for chest pain, orthopnea, leg swelling and PND.       Syncope  Gastrointestinal: Negative for abdominal pain, diarrhea, melena, nausea and vomiting.  Genitourinary: Negative for dysuria, hematuria and urgency.  Musculoskeletal: Negative for falls and joint pain.  Neurological: Positive for dizziness. Negative for tingling, sensory change, focal weakness, seizures, weakness and headaches.  Endo/Heme/Allergies: Negative for polydipsia. Does not bruise/bleed easily.  Psychiatric/Behavioral: Negative for depression and memory loss. The patient is not nervous/anxious.     MEDICATIONS AT HOME:   Prior to Admission medications   Medication Sig Start Date End Date Taking? Authorizing Provider  aspirin 81 MG tablet Take 81 mg by mouth daily.   Yes [provider]  cholecalciferol (VITAMIN D) 1000  UNITS tablet Take 1,000 Units by mouth daily.   Yes [provider]  loratadine (CLARITIN) 10 MG tablet Take 10 mg by mouth daily.   Yes [provider]  Multiple Vitamin (MULTIVITAMIN) tablet Take 1 tablet by mouth daily.   Yes [provider]  omeprazole (PRILOSEC) 20 MG capsule Take 20 mg by mouth daily.   Yes [provider]  sildenafil (VIAGRA) 100 MG tablet Take 100 mg  by mouth daily as needed for erectile dysfunction.    Yes [provider]  Testosterone 10 MG/ACT (2%) GEL Place 4 Pump onto the skin daily.    Yes [provider]  traZODone (DESYREL) 50 MG tablet Take 50 mg by mouth at bedtime.   Yes [provider]      VITAL SIGNS:  Blood pressure (!) 162/99, pulse 60, temperature (!) 97.5 F (36.4 C), resp. rate (!) 22, height 5\' 9"  (1.753 m), weight 68 kg, SpO2 100 %.  PHYSICAL EXAMINATION:  Physical Exam  GENERAL:  78 y.o.-year-old patient lying in the bed in no acute distress.  EYES: Pupils equal, round, reactive to light and accommodation. No scleral icterus. Extraocular muscles intact.  HEENT: Head atraumatic, normocephalic. Oropharynx and nasopharynx clear. No oropharyngeal erythema, moist oral mucosa  NECK:  Supple, no jugular venous distention. No thyroid enlargement, no tenderness.  LUNGS: Normal breath sounds bilaterally, no wheezing, rales, rhonchi. No use of accessory muscles of respiration.  CARDIOVASCULAR: S1, S2 RRR. No murmurs, rubs, gallops, clicks.  ABDOMEN: Soft, nontender, nondistended. Bowel sounds present. No organomegaly or mass.  EXTREMITIES: No pedal edema, cyanosis, or clubbing. + 2 pedal & radial pulses b/l.   NEUROLOGIC: Cranial nerves II through XII are intact. No focal Motor or sensory deficits appreciated b/l PSYCHIATRIC: The patient is alert and oriented x 3.   SKIN: No obvious rash, lesion, or ulcer.   LABORATORY PANEL:   CBC Recent Labs  Lab 06/02/19 1245  WBC 7.8  HGB 14.4  HCT 41.7  PLT 320   ------------------------------------------------------------------------------------------------------------------  Chemistries  Recent Labs  Lab 06/02/19 1245  NA 133*  K 4.2  CL 100  CO2 21*  GLUCOSE 142*  BUN 17  CREATININE 1.20  CALCIUM 8.8*  AST 29  ALT 19  ALKPHOS 37*  BILITOT 0.8    ------------------------------------------------------------------------------------------------------------------  Cardiac Enzymes No results for input(s): TROPONINI in the last 168 hours. ------------------------------------------------------------------------------------------------------------------  RADIOLOGY:  Ct Head Wo Contrast  Result Date: 06/02/2019 CLINICAL DATA:  Syncope.  Found unresponsive. EXAM: CT HEAD WITHOUT CONTRAST TECHNIQUE: Contiguous axial images were obtained from the base of the skull through the vertex without intravenous contrast. COMPARISON:  04/11/2017 head CT. FINDINGS: Brain: No evidence of parenchymal hemorrhage or extra-axial fluid collection. No mass lesion, mass effect, or midline shift. No CT evidence of acute infarction. Nonspecific mild subcortical and periventricular white matter hypodensity, most in keeping with chronic small vessel ischemic change. Cerebral volume is age appropriate. No ventriculomegaly. Vascular: No acute abnormality. Skull: No evidence of calvarial fracture. Sinuses/Orbits: Stable opacification of the visualized portion of the right maxillary sinus. No fluid levels. Other:  The mastoid air cells are unopacified. IMPRESSION: 1. No evidence of acute intracranial abnormality. No evidence of calvarial fracture. 2. 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 3. Chronic right maxillary sinusitis. Electronically Signed   By: Delbert PhenixJason A Poff M.D.   On: 06/02/2019 13:26   Dg Chest Port 1 View  Result Date: 06/02/2019 CLINICAL DATA:  Syncope EXAM: PORTABLE  CHEST 1 VIEW COMPARISON:  None. FINDINGS: The heart size and mediastinal contours are within normal limits. Both lungs are clear. The visualized skeletal structures are unremarkable except for mild scoliosis of the thoracic spine. IMPRESSION: No active disease. Electronically Signed   By: Jerilynn Mages.  Shick M.D.   On: 06/02/2019 13:29     IMPRESSION AND PLAN:   78 y.o. male with  a known history of GERD, hyper lipidemia, urethral stricture, erectile dysfunction who presents to the hospital after a syncopal episode.  1.  Syncope-I suspect this is likely vasovagal syncope from heat exhaustion from the patient being outside in heat for prolonged period of time. -CT head is negative for acute pathology.  Patient has a nonfocal neurological exam. - We will observe on telemetry watch for any arrhythmias, cycle his troponins. -Check echocardiogram, carotid duplex and orthostatic vital signs.  2.  GERD-continue Protonix.  3. Allergic Rhinitis - cont. Claritin.     All the records are reviewed and case discussed with ED provider. Management plans discussed with the patient, family and they are in agreement.  CODE STATUS: Full code  TOTAL TIME TAKING CARE OF THIS PATIENT: 40 minutes.    Henreitta Leber M.D on 06/02/2019 at 3:14 PM  Between 7am to 6pm - Pager - [phone]  After 6pm go to [email or web address] - password [email or web address] [facility]  [place] [facility]  Office  [phone]  CC: Primary care physician; [place] Pink, MD

## 2019-06-02 NOTE — Plan of Care (Signed)
  Problem: Education: Goal: Knowledge of General Education information will improve Description: Including pain rating scale, medication(s)/side effects and non-pharmacologic comfort measures Outcome: Progressing   Problem: Safety: Goal: Ability to remain free from injury will improve Outcome: Not Progressing  Pt admitted with syncopal episode. Refusing bed alarm. Educated

## 2019-06-02 NOTE — ED Notes (Signed)
ED TO INPATIENT HANDOFF REPORT  ED Nurse Name and Phone #: Anderson Malta [phone]  S Name/Age/Gender Melven Sartorius 78 y.o. male Room/Bed: ED05A/ED05A  Code Status   Code Status: Not on file  Home/SNF/Other Home Patient oriented to: self, place, time and situation Is this baseline? Yes   Triage Complete: Triage complete  Chief Complaint altered mental status  Triage Note Pt to ER via EMS from home.  Pt was found outside unresponsive.  EMS reports when they got on scene pt was alert but somewhat disoriented.  Pt has continued to increase alertness en route and is now A&O x 4.   Allergies No Known Allergies  Level of Care/Admitting Diagnosis ED Disposition    ED Disposition Condition [facility] Area: [place] [100120]  Level of Care: Telemetry [5]  Covid Evaluation: Person Under Investigation (PUI)  Diagnosis: Syncope [505397]  Admitting Physician: Henreitta Leber [[id number]]  Attending Physician: Henreitta Leber [[id number]]  PT Class (Do Not Modify): Observation [104]  PT Acc Code (Do Not Modify): Observation [10022]       B Medical/Surgery History Past Medical History:  Diagnosis Date  . Erectile dysfunction   . GERD (gastroesophageal reflux disease)   . Grover's disease   . Hyperlipemia   . Pneumonia   . Urethral stricture    Past Surgical History:  Procedure Laterality Date  . COLONOSCOPY WITH PROPOFOL N/A 07/15/2015   Procedure: COLONOSCOPY WITH PROPOFOL;  Surgeon: Manya Silvas, MD;  Location: [facility] ENDOSCOPY;  Service: Endoscopy;  Laterality: N/A;  . CYSTOSCOPY WITH DIRECT VISION INTERNAL URETHROTOMY    . CYSTOURETHROSCOPY    . DESTRUCTION LESION PENILE    . VASECTOMY       A IV Location/Drains/Wounds Patient Lines/Drains/Airways Status   Active Line/Drains/Airways    Name:   Placement date:   Placement time:   Site:   Days:   Peripheral IV Left Antecubital   -    -    Antecubital              Intake/Output Last 24 hours No intake or output data in the 24 hours ending 06/02/19 1514  Labs/Imaging Results for orders placed or performed during the hospital encounter of 06/02/19 (from the past 48 hour(s))  CBC with Differential     Status: None   Collection Time: 06/02/19 12:45 PM  Result Value Ref Range   WBC 7.8 4.0 - 10.5 K/uL   RBC 4.39 4.22 - 5.81 MIL/uL   Hemoglobin 14.4 13.0 - 17.0 g/dL   HCT 41.7 39.0 - 52.0 %   MCV 95.0 80.0 - 100.0 fL   MCH 32.8 26.0 - 34.0 pg   MCHC 34.5 30.0 - 36.0 g/dL   RDW 11.8 11.5 - 15.5 %   Platelets 320 150 - 400 K/uL   nRBC 0.0 0.0 - 0.2 %   Neutrophils Relative % 64 %   Neutro Abs 5.1 1.7 - 7.7 K/uL   Lymphocytes Relative 17 %   Lymphs Abs 1.3 0.7 - 4.0 K/uL   Monocytes Relative 11 %   Monocytes Absolute 0.9 0.1 - 1.0 K/uL   Eosinophils Relative 6 %   Eosinophils Absolute 0.5 0.0 - 0.5 K/uL   Basophils Relative 1 %   Basophils Absolute 0.0 0.0 - 0.1 K/uL   Immature Granulocytes 1 %   Abs Immature Granulocytes 0.05 0.00 - 0.07 K/uL    Comment: Performed at [facility], [address] [place],  [place] [address]  CK     Status: None   Collection Time: 06/02/19 12:45 PM  Result Value Ref Range   Total CK 93 49 - 397 U/L    Comment: Performed at [facility], [address] [place], [place] [address]  Comprehensive metabolic panel     Status: Abnormal   Collection Time: 06/02/19 12:45 PM  Result Value Ref Range   Sodium 133 (L) 135 - 145 mmol/L   Potassium 4.2 3.5 - 5.1 mmol/L   Chloride 100 98 - 111 mmol/L   CO2 21 (L) 22 - 32 mmol/L   Glucose, Bld 142 (H) 70 - 99 mg/dL   BUN 17 8 - 23 mg/dL   Creatinine, Ser 4.011.20 0.61 - 1.24 mg/dL   Calcium 8.8 (L) 8.9 - 10.3 mg/dL   Total Protein 6.6 6.5 - 8.1 g/dL   Albumin 3.6 3.5 - 5.0 g/dL   AST 29 15 - 41 U/L   ALT 19 0 - 44 U/L   Alkaline Phosphatase 37 (L) 38 - 126 U/L   Total Bilirubin 0.8 0.3 - 1.2 mg/dL   GFR calc non Af Amer 58 (L) >60 mL/min   GFR  calc Af Amer >60 >60 mL/min   Anion gap 12 5 - 15    Comment: Performed at [facility], [address] [place], [place] [address]  Ethanol     Status: None   Collection Time: 06/02/19 12:45 PM  Result Value Ref Range   Alcohol, Ethyl (B) <10 <10 mg/dL    Comment: (NOTE) Lowest detectable limit for serum alcohol is 10 mg/dL. For medical purposes only. Performed at [facility], [address] [place], [place] [address]   Troponin I (High Sensitivity)     Status: None   Collection Time: 06/02/19 12:45 PM  Result Value Ref Range   Troponin I (High Sensitivity) 7 <18 ng/L    Comment: (NOTE) Elevated high sensitivity troponin I (hsTnI) values and significant  changes across serial measurements may suggest ACS but many other  chronic and acute conditions are known to elevate hsTnI results.  Refer to the "Links" section for chest pain algorithms and additional  guidance. Performed at [facility], [address] [place], [place] [address]    Ct Head Wo Contrast  Result Date: 06/02/2019 CLINICAL DATA:  Syncope.  Found unresponsive. EXAM: CT HEAD WITHOUT CONTRAST TECHNIQUE: Contiguous axial images were obtained from the base of the skull through the vertex without intravenous contrast. COMPARISON:  04/11/2017 head CT. FINDINGS: Brain: No evidence of parenchymal hemorrhage or extra-axial fluid collection. No mass lesion, mass effect, or midline shift. No CT evidence of acute infarction. Nonspecific mild subcortical and periventricular white matter hypodensity, most in keeping with chronic small vessel ischemic change. Cerebral volume is age appropriate. No ventriculomegaly. Vascular: No acute abnormality. Skull: No evidence of calvarial fracture. Sinuses/Orbits: Stable opacification of the visualized portion of the right maxillary sinus. No fluid levels. Other:  The mastoid air cells are unopacified. IMPRESSION: 1. No evidence of acute intracranial abnormality. No  evidence of calvarial fracture. 2. 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 3. Chronic right maxillary sinusitis. Electronically Signed   By: Delbert PhenixJason A Poff M.D.   On: 06/02/2019 13:26   Dg Chest Port 1 View  Result Date: 06/02/2019 CLINICAL DATA:  Syncope EXAM: PORTABLE CHEST 1 VIEW COMPARISON:  None. FINDINGS: The heart size and mediastinal contours are within normal limits. Both lungs are clear.  The visualized skeletal structures are unremarkable except for mild scoliosis of the thoracic spine. IMPRESSION: No active disease. Electronically Signed   By: Judie PetitM.  Shick M.D.   On: 06/02/2019 13:29    Pending Labs Unresulted Labs (From admission, onward)    Start     Ordered   06/02/19 1502  Troponin I (High Sensitivity)  STAT Now then every 2 hours,   STAT    Question:  Indication  Answer:  Suspect ACS   06/02/19 1501   06/02/19 1344  SARS Coronavirus 2 (CEPHEID - Performed in [facility] lab), Hosp Order  (Asymptomatic Patients Labs)  Once,   STAT    Question:  Rule Out  Answer:  Yes   06/02/19 1343   06/02/19 1235  Troponin I (High Sensitivity)  STAT Now then every 2 hours,   STAT     06/02/19 1234   06/02/19 1235  Urinalysis, Complete w Microscopic  ONCE - STAT,   STAT     06/02/19 1234   Signed and Held  CBC  (enoxaparin (LOVENOX)    CrCl >/= 30 ml/min)  Once,   R    Comments: Baseline for enoxaparin therapy IF NOT ALREADY DRAWN.  Notify MD if PLT < 100 K.    Signed and Held   Signed and Held  Creatinine, serum  (enoxaparin (LOVENOX)    CrCl >/= 30 ml/min)  Once,   R    Comments: Baseline for enoxaparin therapy IF NOT ALREADY DRAWN.    Signed and Held   Signed and Held  Creatinine, serum  (enoxaparin (LOVENOX)    CrCl >/= 30 ml/min)  Weekly,   R    Comments: while on enoxaparin therapy    Signed and Held          Vitals/Pain Today's Vitals   06/02/19 1229 06/02/19 1230 06/02/19 1430  BP: 133/75  (!) 162/99  Pulse: 67  60  Resp: 18  (!) 22   Temp: (!) 97.5 F (36.4 C)    SpO2: 98%  100%  Weight:  68 kg   Height:  5\' 9"  (1.753 m)   PainSc:  0-No pain     Isolation Precautions No active isolations  Medications Medications  sodium chloride 0.9 % bolus 1,000 mL (1,000 mLs Intravenous New Bag/Given 06/02/19 1446)  sodium chloride 0.9 % bolus 500 mL (0 mLs Intravenous Stopped 06/02/19 1513)    Mobility walks Low fall risk   Focused Assessments Cardiac Assessment Handoff:  Cardiac Rhythm: Normal sinus rhythm Lab Results  Component Value Date   CKTOTAL 93 06/02/2019   No results found for: DDIMER Does the Patient currently have chest pain? No     R Recommendations: See Admitting Provider Note  Report given to:   Additional Notes:

## 2019-06-02 NOTE — ED Triage Notes (Signed)
Pt to ER via EMS from home.  Pt was found outside unresponsive.  EMS reports when they got on scene pt was alert but somewhat disoriented.  Pt has continued to increase alertness en route and is now A&O x 4.

## 2019-06-02 NOTE — ED Provider Notes (Signed)
[facility] Emergency Department Provider Note  ____________________________________________   I have reviewed the triage vital signs and the nursing notes. Where available I have reviewed prior notes and, if possible and indicated, outside hospital notes.    HISTORY  Chief Complaint Loss of Consciousness    HPI Brent Kim is a 78 y.o. 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  He went out to do something with a bird feeder and that is all he remembers until paramedics came along.  He states he was in his normal health this morning.  He has poor recollection of what happened prior to that.  He is a little bit confused compared to his baseline.  Unknown if there was trauma.  He has no complaints of pain or focal numbness or weakness no chest pain no shortness of breath no nausea no vomiting.  Has not done this before.  No recent changes in medication that he knows about.  Per his wife he was outside with his wife for a while this am and she was away briefly and when she came back to him she found him lying on the ground. Felt hot and sweaty. No complaints. Was in the sun before she believes. They were outside for about an hour. It is very hot and humid. Also walked 3 miles for him yesterday.   Past Medical History:  Diagnosis Date  . Erectile dysfunction   . GERD (gastroesophageal reflux disease)   . Grover's disease   . Hyperlipemia   . Pneumonia   . Urethral stricture     There are no active problems to display for this patient.   Past Surgical History:  Procedure Laterality Date  . COLONOSCOPY WITH PROPOFOL N/A 07/15/2015   Procedure: COLONOSCOPY WITH PROPOFOL;  Surgeon: Scot Junobert T Elliott, MD;  [facility];  Service:  Endoscopy;  Laterality: N/A;  . CYSTOSCOPY WITH DIRECT VISION INTERNAL URETHROTOMY    . CYSTOURETHROSCOPY    . DESTRUCTION LESION PENILE    . VASECTOMY      Prior to Admission medications   Medication Sig Start Date End Date Taking? Authorizing Provider  aspirin 81 MG tablet Take 81 mg by mouth daily.    [provider]  cholecalciferol (VITAMIN D) 1000 UNITS tablet Take 1,000 Units by mouth daily.    [provider]  imiquimod (ALDARA) 5 % cream Apply topically 3 (three) times a week.    [provider]  Multiple Vitamin (MULTIVITAMIN) tablet Take 1 tablet by mouth daily.    [provider]  omeprazole (PRILOSEC) 20 MG capsule Take 20 mg by mouth daily.    [provider]  sildenafil (VIAGRA) 50 MG tablet Take 50 mg by mouth daily as needed for erectile dysfunction.    [provider]  vitamin B-12 (CYANOCOBALAMIN) 1000 MCG tablet Take 1,000 mcg by mouth daily.    [provider]    Allergies Patient has no known allergies.  No family history on file.  Social History Social History   Tobacco Use  . Smoking status: Never Smoker  . Smokeless tobacco: Never Used  Substance Use Topics  . Alcohol use: No  . Drug use: Not on file    Review of Systems Constitutional: No fever/chills Eyes: No visual changes. ENT: No sore throat. No stiff neck no  neck pain Cardiovascular: Denies chest pain. Respiratory: Denies shortness of breath. Gastrointestinal:   no vomiting.  No diarrhea.  No constipation. Genitourinary: Negative for dysuria. Musculoskeletal: Negative lower extremity swelling Skin: Negative for rash. Neurological: Negative for severe headaches, focal weakness or numbness.   ____________________________________________   PHYSICAL EXAM:  VITAL SIGNS: ED Triage Vitals  Enc Vitals Group     BP 06/02/19 1229 133/75     Pulse Rate 06/02/19 1229 67     Resp 06/02/19 1229 18     Temp 06/02/19 1229 (!) 97.5  F (36.4 C)     Temp src --      SpO2 06/02/19 1229 98 %     Weight 06/02/19 1230 150 lb (68 kg)     Height 06/02/19 1230 5\' 9"  (1.753 m)     Head Circumference --      Peak Flow --      Pain Score 06/02/19 1230 0     Pain Loc --      Pain Edu? --      Excl. in [place]? --     Constitutional: Alert and oriented to name and place unsure of the date. Well appearing and in no acute distress. Eyes: Conjunctivae are normal Head: Atraumatic HEENT: No congestion/rhinnorhea. Mucous membranes are moist.  Oropharynx non-erythematous Neck:   Nontender with no meningismus, no masses, no stridor Cardiovascular: Normal rate, regular rhythm. Grossly normal heart sounds.  Good peripheral circulation. Respiratory: Normal respiratory effort.  No retractions. Lungs CTAB. Abdominal: Soft and nontender. No distention. No guarding no rebound Back:  There is no focal tenderness or step off.  there is no midline tenderness there are no lesions noted. there is no CVA tenderness Musculoskeletal: No lower extremity tenderness, no upper extremity tenderness. No joint effusions, no DVT signs strong distal pulses no edema Neurologic:  Normal speech and language. No gross focal neurologic deficits are appreciated.  Skin:  Skin is warm, dry and intact. No rash noted. Psychiatric: Mood and affect are normal. Speech and behavior are normal.  ____________________________________________   LABS (all labs ordered are listed, but only abnormal results are displayed)  Labs Reviewed  COMPREHENSIVE METABOLIC PANEL - Abnormal; Notable for the following components:      Result Value   Sodium 133 (*)    CO2 21 (*)    Glucose, Bld 142 (*)    Calcium 8.8 (*)    Alkaline Phosphatase 37 (*)    GFR calc non Af Amer 58 (*)    All other components within normal limits  SARS CORONAVIRUS 2 (HOSPITAL ORDER, [facility])  CBC WITH DIFFERENTIAL/PLATELET  CK  ETHANOL  TROPONIN I (HIGH SENSITIVITY)   TROPONIN I (HIGH SENSITIVITY)  URINALYSIS, COMPLETE (UACMP) WITH MICROSCOPIC  CBG MONITORING, ED    Pertinent labs  results that were available during my care of the patient were reviewed by me and considered in my medical decision making (see chart for details). ____________________________________________  EKG  I personally interpreted any EKGs ordered by me or triage   2 EKGs were obtained, the first shows what is likely sinus rhythm with occasional changes in tempo that are preceded by P waves.  No evidence of ST elevation or depression, LAD noted.  Second EKG shows more dysrhythmia which again looks to be sinus. ____________________________________________  RADIOLOGY  Pertinent labs & imaging results that were available during my care of the patient were reviewed by me and considered in my medical decision making (  see chart for details). If possible, patient and/or family made aware of any abnormal findings.  Ct Head Wo Contrast  Result Date: 06/02/2019 CLINICAL DATA:  Syncope.  Found unresponsive. EXAM: CT HEAD WITHOUT CONTRAST TECHNIQUE: Contiguous axial images were obtained from the base of the skull through the vertex without intravenous contrast. COMPARISON:  04/11/2017 head CT. FINDINGS: Brain: No evidence of parenchymal hemorrhage or extra-axial fluid collection. No mass lesion, mass effect, or midline shift. No CT evidence of acute infarction. Nonspecific mild subcortical and periventricular white matter hypodensity, most in keeping with chronic small vessel ischemic change. Cerebral volume is age appropriate. No ventriculomegaly. Vascular: No acute abnormality. Skull: No evidence of calvarial fracture. Sinuses/Orbits: Stable opacification of the visualized portion of the right maxillary sinus. No fluid levels. Other:  The mastoid air cells are unopacified. IMPRESSION: 1. No evidence of acute intracranial abnormality. No evidence of calvarial fracture. 2. 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 3. Chronic right maxillary sinusitis. Electronically Signed   By: Delbert PhenixJason A Poff M.D.   On: 06/02/2019 13:26   Dg Chest Port 1 View  Result Date: 06/02/2019 CLINICAL DATA:  Syncope EXAM: PORTABLE CHEST 1 VIEW COMPARISON:  None. FINDINGS: The heart size and mediastinal contours are within normal limits. Both lungs are clear. The visualized skeletal structures are unremarkable except for mild scoliosis of the thoracic spine. IMPRESSION: No active disease. Electronically Signed   By: Judie PetitM.  Shick M.D.   On: 06/02/2019 13:29   ____________________________________________    PROCEDURES  Procedure(s) performed: None  Procedures  Critical Care performed: None  ____________________________________________   INITIAL IMPRESSION / ASSESSMENT AND PLAN / ED COURSE  Pertinent labs & imaging results that were available during my care of the patient were reviewed by me and considered in my medical decision making (see chart for details).  Patient here after a syncopal event as he states here he becomes more clear minded although he still not exactly sure of the day of the week which is atypical for him.  He has no evidence of significant trauma there is no focal numbness or weakness to suggest CVA, most likely I feel the patient will became overwhelmed there is a very high heat index and he was outside working in the sun.  However, his EKG does show some what appears to be likely sinus dysrhythmia there is an occasional pause but no formal block that I can see, I think this would benefit from observational stay I have talked to his wife about this as well.  We will observe him in the hospital overnight for syncope confusion and likely mild heat exhaustion    ____________________________________________   FINAL CLINICAL IMPRESSION(S) / ED DIAGNOSES  Final diagnoses:  Syncope      This chart was dictated using voice recognition software.  Despite best  efforts to proofread,  errors can occur which can change meaning.      Jeanmarie PlantMcShane,  A, MD 06/02/19 1351

## 2019-06-03 ENCOUNTER — Observation Stay: Payer: Medicare Other

## 2019-06-03 ENCOUNTER — Observation Stay (HOSPITAL_BASED_OUTPATIENT_CLINIC_OR_DEPARTMENT_OTHER)
Admit: 2019-06-03 | Discharge: 2019-06-03 | Disposition: A | Discharge status: Home / Self Care | Payer: Medicare Other | Attending: Specialist | Admitting: Specialist

## 2019-06-03 DIAGNOSIS — R55 Syncope and collapse: Secondary | ICD-10-CM | POA: Diagnosis not present

## 2019-06-03 LAB — ECHOCARDIOGRAM COMPLETE
Height: 69 in
Weight: 2481.6 oz

## 2019-06-03 MED ORDER — SODIUM CHLORIDE 0.9% FLUSH: 3.0000 mL | Freq: Two times a day (BID) | INTRAVENOUS | Status: DC

## 2019-06-03 NOTE — Progress Notes (Signed)
*  PRELIMINARY RESULTS* Echocardiogram 2D Echocardiogram has been performed.  Brent Kim 06/03/2019, 9:50 AM 

## 2019-06-03 NOTE — Discharge Summary (Signed)
[facility] [place] [facility]  Brent Kim, 78 y.o., DOB Sep 16, 1941, MRN [id number]. Admission date: 06/02/2019 Discharge Date 06/03/2019 Primary MD Jerl MinaHedrick, James, MD Admitting Physician Houston SirenVivek J Sainani, MD  Admission Diagnosis  Syncope [R55] Heat exhaustion, initial encounter [T67.5XXA]  Discharge Diagnosis   Active Problems:   Syncope likely due to heat Chronic asymptomatic bradycardia GERD Allergic rhinitis     Hospital Course Patient 78 year old with history of GERD hyperlipidemia urethral stricture and erectile dysfunction presented to the hospital with syncope.  Patient was outside working when he passed out.  He was noted to have bradycardia which patient states that normally his heart rate runs into the 40s chronically.  He normally walks 2 to 3 miles a day.  He walks 5 miles a day few times a week.  Patient was placed under observation.  Overnight no arrhythmia was noted.  He is noted to have bradycardia.  He did not have any chest pain or palpitations.  His echocardiogram shows a normal EF.  And no significant valvular disease.  Carotid Doppler shows minor irregularities in the carotids without any significant stenosis.  If he has another recurrence of his syncope he will need a Holter monitor.            Consults  None  Significant Tests:  See full reports for all details     Ct Head Wo Contrast  Result Date: 06/02/2019 CLINICAL DATA:  Syncope.  Found unresponsive. EXAM: CT HEAD WITHOUT CONTRAST TECHNIQUE: Contiguous axial images were obtained from the base of the skull through the vertex without intravenous contrast. COMPARISON:  04/11/2017 head CT. FINDINGS: Brain: No evidence of parenchymal hemorrhage or extra-axial fluid collection. No mass lesion, mass effect, or midline shift. No CT evidence of acute infarction. Nonspecific mild subcortical and periventricular white matter hypodensity, most in keeping with chronic small vessel ischemic  change. Cerebral volume is age appropriate. No ventriculomegaly. Vascular: No acute abnormality. Skull: No evidence of calvarial fracture. Sinuses/Orbits: Stable opacification of the visualized portion of the right maxillary sinus. No fluid levels. Other:  The mastoid air cells are unopacified. IMPRESSION: 1. No evidence of acute intracranial abnormality. No evidence of calvarial fracture. 2. 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 3. Chronic right maxillary sinusitis. Electronically Signed   By: Delbert PhenixJason A Poff M.D.   On: 06/02/2019 13:26   [place] Carotid Bilateral  Result Date: 06/03/2019 CLINICAL DATA:  Syncope, hyperlipidemia EXAM: BILATERAL CAROTID DUPLEX ULTRASOUND TECHNIQUE: Wallace CullensGray scale imaging, color Doppler and duplex ultrasound were performed of bilateral carotid and vertebral arteries in the neck. COMPARISON:  None. FINDINGS: Criteria: Quantification of carotid stenosis is based on velocity parameters that correlate the residual internal carotid diameter with NASCET-based stenosis levels, using the diameter of the distal internal carotid lumen as the denominator for stenosis measurement. The following velocity measurements were obtained: RIGHT ICA: 84/35 cm/sec CCA: 82/22 cm/sec SYSTOLIC ICA/CCA RATIO:  1.0 ECA: 148 cm/sec LEFT ICA: 73/22 cm/sec CCA: 80/21 cm/sec SYSTOLIC ICA/CCA RATIO:  0.9 ECA: 55 cm/sec RIGHT CAROTID ARTERY: Minor echogenic shadowing plaque formation. No hemodynamically significant right ICA stenosis, velocity elevation, or turbulent flow. Degree of narrowing less than 50%. RIGHT VERTEBRAL ARTERY:  Antegrade LEFT CAROTID ARTERY: Similar scattered minor echogenic plaque formation. No hemodynamically significant left ICA stenosis, velocity elevation, or turbulent flow. LEFT VERTEBRAL ARTERY:  Antegrade IMPRESSION: Minor carotid atherosclerosis. No hemodynamically significant ICA stenosis. Degree of narrowing less than 50% bilaterally by ultrasound criteria.  Patent antegrade vertebral  flow bilaterally Electronically Signed   By: Judie PetitM.  Shick M.D.   On: 06/03/2019 09:42   Dg Chest Port 1 View  Result Date: 06/02/2019 CLINICAL DATA:  Syncope EXAM: PORTABLE CHEST 1 VIEW COMPARISON:  None. FINDINGS: The heart size and mediastinal contours are within normal limits. Both lungs are clear. The visualized skeletal structures are unremarkable except for mild scoliosis of the thoracic spine. IMPRESSION: No active disease. Electronically Signed   By: Judie PetitM.  Shick M.D.   On: 06/02/2019 13:29       Today   Subjective:   Brent Kim patient doing much better breathing improved o Objective:   Blood pressure 135/88, pulse (!) 59, temperature 97.7 F (36.5 C), temperature source Oral, resp. rate 16, height 5\' 9"  (1.753 m), weight 70.4 kg, SpO2 96 %.  .  Intake/Output Summary (Last 24 hours) at 06/03/2019 1141 Last data filed at 06/02/2019 2120 Gross per 24 hour  Intake 1233.33 ml  Output 650 ml  Net 583.33 ml    Exam VITAL SIGNS: Blood pressure 135/88, pulse (!) 59, temperature 97.7 F (36.5 C), temperature source Oral, resp. rate 16, height 5\' 9"  (1.753 m), weight 70.4 kg, SpO2 96 %.  GENERAL:  78 y.o.-year-old patient lying in the bed with no acute distress.  EYES: Pupils equal, round, reactive to light and accommodation. No scleral icterus. Extraocular muscles intact.  HEENT: Head atraumatic, normocephalic. Oropharynx and nasopharynx clear.  NECK:  Supple, no jugular venous distention. No thyroid enlargement, no tenderness.  LUNGS: Normal breath sounds bilaterally, no wheezing, rales,rhonchi or crepitation. No use of accessory muscles of respiration.  CARDIOVASCULAR: S1, S2 normal. No murmurs, rubs, or gallops.  ABDOMEN: Soft, nontender, nondistended. Bowel sounds present. No organomegaly or mass.  EXTREMITIES: No pedal edema, cyanosis, or clubbing.  NEUROLOGIC: Cranial nerves II through XII are intact. Muscle strength 5/5 in all extremities.  Sensation intact. Gait not checked.  PSYCHIATRIC: The patient is alert and oriented x 3.  SKIN: No obvious rash, lesion, or ulcer.   Data Review     CBC w Diff:  Lab Results  Component Value Date   WBC 7.8 06/02/2019   HGB 14.4 06/02/2019   HCT 41.7 06/02/2019   PLT 320 06/02/2019   LYMPHOPCT 17 06/02/2019   MONOPCT 11 06/02/2019   EOSPCT 6 06/02/2019   BASOPCT 1 06/02/2019   CMP:  Lab Results  Component Value Date   NA 133 (L) 06/02/2019   K 4.2 06/02/2019   CL 100 06/02/2019   CO2 21 (L) 06/02/2019   BUN 17 06/02/2019   CREATININE 1.20 06/02/2019   PROT 6.6 06/02/2019   ALBUMIN 3.6 06/02/2019   BILITOT 0.8 06/02/2019   ALKPHOS 37 (L) 06/02/2019   AST 29 06/02/2019   ALT 19 06/02/2019  .  Micro Results Recent Results (from the past 240 hour(s))  SARS Coronavirus 2 (CEPHEID - Performed in [facility] lab), Hosp Order     Status: None   Collection Time: 06/02/19  2:48 PM   Specimen: Nasopharyngeal Swab  Result Value Ref Range Status   SARS Coronavirus 2 NEGATIVE NEGATIVE Final    Comment: (NOTE) If result is NEGATIVE SARS-CoV-2 target nucleic acids are NOT DETECTED. The SARS-CoV-2 RNA is generally detectable in upper and lower  respiratory specimens during the acute phase of infection. The lowest  concentration of SARS-CoV-2 viral copies this assay can detect is 250  copies / mL. A negative result does not preclude SARS-CoV-2 infection  and should not be  used as the sole basis for treatment or other  patient management decisions.  A negative result may occur with  improper specimen collection / handling, submission of specimen other  than nasopharyngeal swab, presence of viral mutation(s) within the  areas targeted by this assay, and inadequate number of viral copies  (<250 copies / mL). A negative result must be combined with clinical  observations, patient history, and epidemiological information. If result is POSITIVE SARS-CoV-2 target nucleic  acids are DETECTED. The SARS-CoV-2 RNA is generally detectable in upper and lower  respiratory specimens dur ing the acute phase of infection.  Positive  results are indicative of active infection with SARS-CoV-2.  Clinical  correlation with patient history and other diagnostic information is  necessary to determine patient infection status.  Positive results do  not rule out bacterial infection or co-infection with other viruses. If result is PRESUMPTIVE POSTIVE SARS-CoV-2 nucleic acids MAY BE PRESENT.   A presumptive positive result was obtained on the submitted specimen  and confirmed on repeat testing.  While 2019 novel coronavirus  (SARS-CoV-2) nucleic acids may be present in the submitted sample  additional confirmatory testing may be necessary for epidemiological  and / or clinical management purposes  to differentiate between  SARS-CoV-2 and other Sarbecovirus currently known to infect humans.  If clinically indicated additional testing with an alternate test  methodology [phone]) is advised. The SARS-CoV-2 RNA is generally  detectable in upper and lower respiratory sp ecimens during the acute  phase of infection. The expected result is Negative. Fact Sheet for Patients:  StrictlyIdeas.no Fact Sheet for Healthcare Providers: BankingDealers.co.za This test is not yet approved or cleared by the [place] FDA and has been authorized for detection and/or diagnosis of SARS-CoV-2 by FDA under an Emergency Use Authorization (EUA).  This EUA will remain in effect (meaning this test can be used) for the duration of the COVID-19 declaration under Section 564(b)(1) of the Act, 21 U.S.C. section 360bbb-3(b)(1), unless the authorization is terminated or revoked sooner. Performed at [facility], [address] [place], [place] [address]         Code Status Orders  (From admission, onward)         Start     Ordered    06/02/19 1619  Full code  Continuous     06/02/19 1619        Code Status History    This patient has a current code status but no historical code status.   Advance Care Planning Activity            Discharge Medications   Allergies as of 06/03/2019   No Known Allergies     Medication List    TAKE these medications   aspirin 81 MG tablet Take 81 mg by mouth daily.   cholecalciferol 1000 units tablet Commonly known as: VITAMIN D Take 1,000 Units by mouth daily.   loratadine 10 MG tablet Commonly known as: CLARITIN Take 10 mg by mouth daily.   multivitamin tablet Take 1 tablet by mouth daily.   omeprazole 20 MG capsule Commonly known as: PRILOSEC Take 20 mg by mouth daily.   sildenafil 100 MG tablet Commonly known as: VIAGRA Take 100 mg by mouth daily as needed for erectile dysfunction.   Testosterone 10 MG/ACT (2%) Gel Place 4 Pump onto the skin daily.   traZODone 50 MG tablet Commonly known as: DESYREL Take 50 mg by mouth at bedtime.  Total Time in preparing paper work, data evaluation and todays exam - 35 minutes  Auburn BilberryShreyang Ellia Knowlton M.D on 06/03/2019 at 11:41 AM [facility]   Office  [phone]

## 2019-06-03 NOTE — Progress Notes (Signed)
Discharged to home with his wife.  No new prescriptions.  No follow up appointments.

## 2019-09-18 ENCOUNTER — Encounter: Payer: Self-pay | Admitting: *Deleted

## 2019-09-18 ENCOUNTER — Other Ambulatory Visit: Payer: Self-pay

## 2019-09-22 ENCOUNTER — Other Ambulatory Visit
Admission: RE | Admit: 2019-09-22 | Discharge: 2019-09-22 | Disposition: A | Discharge status: Home / Self Care | Payer: Medicare Other | Source: Ambulatory Visit | Attending: Ophthalmology | Admitting: Ophthalmology

## 2019-09-22 DIAGNOSIS — Z01812 Encounter for preprocedural laboratory examination: Secondary | ICD-10-CM | POA: Insufficient documentation

## 2019-09-22 DIAGNOSIS — Z20828 Contact with and (suspected) exposure to other viral communicable diseases: Secondary | ICD-10-CM | POA: Diagnosis not present

## 2019-09-22 LAB — SARS CORONAVIRUS 2 (TAT 6-24 HRS): SARS Coronavirus 2: NEGATIVE

## 2019-09-22 NOTE — Discharge Instructions (Signed)

## 2019-09-26 ENCOUNTER — Ambulatory Visit: Payer: Medicare Other | Admitting: Anesthesiology

## 2019-09-26 ENCOUNTER — Ambulatory Visit
Admission: RE | Admit: 2019-09-26 | Discharge: 2019-09-26 | Disposition: A | Discharge status: Home / Self Care | Payer: Medicare Other | Attending: Ophthalmology | Admitting: Ophthalmology

## 2019-09-26 ENCOUNTER — Encounter
Admission: RE | Disposition: A | Discharge status: Home / Self Care | Payer: Self-pay | Source: Home / Self Care | Attending: Ophthalmology

## 2019-09-26 ENCOUNTER — Other Ambulatory Visit: Payer: Self-pay

## 2019-09-26 DIAGNOSIS — Z7982 Long term (current) use of aspirin: Secondary | ICD-10-CM | POA: Insufficient documentation

## 2019-09-26 DIAGNOSIS — Z79899 Other long term (current) drug therapy: Secondary | ICD-10-CM | POA: Diagnosis not present

## 2019-09-26 DIAGNOSIS — K219 Gastro-esophageal reflux disease without esophagitis: Secondary | ICD-10-CM | POA: Insufficient documentation

## 2019-09-26 DIAGNOSIS — H2512 Age-related nuclear cataract, left eye: Secondary | ICD-10-CM | POA: Diagnosis not present

## 2019-09-26 HISTORY — PX: CATARACT EXTRACTION W/PHACO: SHX586

## 2019-09-26 SURGERY — PHACOEMULSIFICATION, CATARACT, WITH IOL INSERTION
Anesthesia: Monitor Anesthesia Care | Site: Eye | Laterality: Left

## 2019-09-26 MED ORDER — TETRACAINE HCL 0.5 % OP SOLN
1.0000 [drp] | OPHTHALMIC | Status: DC | PRN
  Administered 2019-09-26 (×3): 1 [drp] via OPHTHALMIC

## 2019-09-26 MED ORDER — ONDANSETRON HCL 4 MG/2ML IJ SOLN: 4.0000 mg | Freq: Once | INTRAMUSCULAR | Status: DC | PRN

## 2019-09-26 MED ORDER — MIDAZOLAM HCL 2 MG/2ML IJ SOLN
INTRAMUSCULAR | Status: DC | PRN
  Administered 2019-09-26: 2 mg via INTRAVENOUS

## 2019-09-26 MED ORDER — LIDOCAINE HCL (PF) 2 % IJ SOLN
INTRAOCULAR | Status: DC | PRN
  Administered 2019-09-26: 1 mL

## 2019-09-26 MED ORDER — EPINEPHRINE PF 1 MG/ML IJ SOLN
INTRAOCULAR | Status: DC | PRN
  Administered 2019-09-26: 61 mL via OPHTHALMIC

## 2019-09-26 MED ORDER — BRIMONIDINE TARTRATE-TIMOLOL 0.2-0.5 % OP SOLN
OPHTHALMIC | Status: DC | PRN
  Administered 2019-09-26: 1 [drp] via OPHTHALMIC

## 2019-09-26 MED ORDER — ARMC OPHTHALMIC DILATING DROPS
1.0000 "application " | OPHTHALMIC | Status: DC | PRN
  Administered 2019-09-26 (×3): 1 via OPHTHALMIC

## 2019-09-26 MED ORDER — NA CHONDROIT SULF-NA HYALURON 40-17 MG/ML IO SOLN
INTRAOCULAR | Status: DC | PRN
  Administered 2019-09-26: 1 mL via INTRAOCULAR

## 2019-09-26 MED ORDER — LACTATED RINGERS IV SOLN: INTRAVENOUS | Status: DC

## 2019-09-26 MED ORDER — FENTANYL CITRATE (PF) 100 MCG/2ML IJ SOLN
INTRAMUSCULAR | Status: DC | PRN
  Administered 2019-09-26 (×2): 50 ug via INTRAVENOUS

## 2019-09-26 MED ORDER — MOXIFLOXACIN HCL 0.5 % OP SOLN
OPHTHALMIC | Status: DC | PRN
  Administered 2019-09-26: 0.2 mL via OPHTHALMIC

## 2019-09-26 SURGICAL SUPPLY — 24 items
CANNULA ANT/CHMB 27G (MISCELLANEOUS) ×2 IMPLANT
CANNULA ANT/CHMB 27GA (MISCELLANEOUS) ×6 IMPLANT
GLOVE SURG LX 8.0 MICRO (GLOVE) ×4
GLOVE SURG LX STRL 8.0 MICRO (GLOVE) ×1 IMPLANT
GLOVE SURG TRIUMPH 8.0 PF LTX (GLOVE) ×3 IMPLANT
GOWN STRL REUS W/ TWL LRG LVL3 (GOWN DISPOSABLE) ×2 IMPLANT
GOWN STRL REUS W/TWL LRG LVL3 (GOWN DISPOSABLE) ×4
LENS IOL IQ PAN TRC 50 22.0 IMPLANT
LENS IOL PANOP TORIC 50 22.0 ×1 IMPLANT
LENS IOL PANOPTIX TORIC 22.0 ×2 IMPLANT
MARKER SKIN DUAL TIP RULER LAB (MISCELLANEOUS) ×3 IMPLANT
NDL FILTER BLUNT 18X1 1/2 (NEEDLE) ×1 IMPLANT
NDL RETROBULBAR .5 NSTRL (NEEDLE) ×3 IMPLANT
NEEDLE FILTER BLUNT 18X 1/2SAF (NEEDLE) ×2
NEEDLE FILTER BLUNT 18X1 1/2 (NEEDLE) ×1 IMPLANT
PACK EYE AFTER SURG (MISCELLANEOUS) ×3 IMPLANT
PACK OPTHALMIC (MISCELLANEOUS) ×3 IMPLANT
PACK PORFILIO (MISCELLANEOUS) ×3 IMPLANT
SUT ETHILON 10-0 CS-B-6CS-B-6 (SUTURE)
SUTURE EHLN 10-0 CS-B-6CS-B-6 (SUTURE) IMPLANT
SYR 3ML LL SCALE MARK (SYRINGE) ×3 IMPLANT
SYR TB 1ML LUER SLIP (SYRINGE) ×3 IMPLANT
WATER STERILE IRR 250ML POUR (IV SOLUTION) ×3 IMPLANT
WIPE NON LINTING 3.25X3.25 (MISCELLANEOUS) ×3 IMPLANT

## 2019-09-26 NOTE — H&P (Signed)
All labs reviewed. Abnormal studies sent to patients PCP when indicated.  Previous H&P reviewed, patient examined, there are NO CHANGES.  Brent Kim Porfilio10/27/202011:15 AM

## 2019-09-26 NOTE — Anesthesia Procedure Notes (Signed)
Procedure Name: MAC Performed by: Loula Marcella, CRNA Pre-anesthesia Checklist: Patient identified, Emergency Drugs available, Suction available, Timeout performed and Patient being monitored Patient Re-evaluated:Patient Re-evaluated prior to induction Oxygen Delivery Method: Nasal cannula Placement Confirmation: positive ETCO2       

## 2019-09-26 NOTE — Anesthesia Preprocedure Evaluation (Signed)
Anesthesia Evaluation  Patient identified by MRN, date of birth, ID band Patient awake    Reviewed: Allergy & Precautions, NPO status   Airway Mallampati: II  TM Distance: >3 FB     Dental   Pulmonary neg pulmonary ROS,    breath sounds clear to auscultation       Cardiovascular  Rhythm:Regular Rate:Normal  HLD   Neuro/Psych    GI/Hepatic GERD  ,  Endo/Other    Renal/GU      Musculoskeletal   Abdominal   Peds  Hematology   Anesthesia Other Findings   Reproductive/Obstetrics                             Anesthesia Physical Anesthesia Plan  ASA: II  Anesthesia Plan: MAC   Post-op Pain Management:    Induction: Intravenous  PONV Risk Score and Plan: TIVA  Airway Management Planned: Nasal Cannula  Additional Equipment:   Intra-op Plan:   Post-operative Plan:   Informed Consent: I have reviewed the patients History and Physical, chart, labs and discussed the procedure including the risks, benefits and alternatives for the proposed anesthesia with the patient or authorized representative who has indicated his/her understanding and acceptance.       Plan Discussed with: CRNA  Anesthesia Plan Comments:         Anesthesia Quick Evaluation

## 2019-09-26 NOTE — Op Note (Signed)
PREOPERATIVE DIAGNOSIS:  Nuclear sclerotic cataract of the left eye.   POSTOPERATIVE DIAGNOSIS:  Nuclear sclerotic cataract of the left eye.   OPERATIVE PROCEDURE:@   SURGEON:  Birder Robson, MD.   ANESTHESIA:  Anesthesiologist: Veda Canning, MD CRNA: Mayme Genta, CRNA  1.      Managed anesthesia care. 2.     0.37ml of Shugarcaine was instilled following the paracentesis   COMPLICATIONS:  None.   TECHNIQUE:   Stop and chop   DESCRIPTION OF PROCEDURE:  The patient was examined and consented in the preoperative holding area where the aforementioned topical anesthesia was applied to the left eye and then brought back to the Operating Room where the left eye was prepped and draped in the usual sterile ophthalmic fashion and a lid speculum was placed. A paracentesis was created with the side port blade and the anterior chamber was filled with viscoelastic. A near clear corneal incision was performed with the steel keratome. A continuous curvilinear capsulorrhexis was performed with a cystotome followed by the capsulorrhexis forceps. Hydrodissection and hydrodelineation were carried out with BSS on a blunt cannula. The lens was removed in a stop and chop  technique and the remaining cortical material was removed with the irrigation-aspiration handpiece. The capsular bag was inflated with viscoelastic and the TFNT 50- lens was placed in the capsular bag without complication. The remaining viscoelastic was removed from the eye with the irrigation-aspiration handpiece. The lens was placed at 165 degrees. The wounds were hydrated. The anterior chamber was flushed with BSS and the eye was inflated to physiologic pressure. 0.62ml Vigamox was placed in the anterior chamber. The wounds were found to be water tight. The eye was dressed with Combigan. The patient was given protective glasses to wear throughout the day and a shield with which to sleep tonight. The patient was also given drops with which to  begin a drop regimen today and will follow-up with me in one day. Implant Name Type Inv. Item Serial No. Manufacturer Lot No. LRB No. Used Action  ALCON ACRYSOF PANOPTIX TORIC IOL Intraocular Lens  [id number] ALCON  Left 1 Implanted    Procedure(s) with comments: CATARACT EXTRACTION PHACO AND INTRAOCULAR LENS PLACEMENT (IOC) LEFT PANOPTIX TORIC (Left) - 0:46 16.3% 7.54  Electronically signed: Birder Robson 09/26/2019 12:00 PM

## 2019-09-26 NOTE — Transfer of Care (Signed)
Immediate Anesthesia Transfer of Care Note  Patient: Brent Kim  Procedure(s) Performed: CATARACT EXTRACTION PHACO AND INTRAOCULAR LENS PLACEMENT (IOC) LEFT PANOPTIX TORIC (Left Eye)  Patient Location: PACU  Anesthesia Type: MAC  Level of Consciousness: awake, alert  and patient cooperative  Airway and Oxygen Therapy: Patient Spontanous Breathing and Patient connected to supplemental oxygen  Post-op Assessment: Post-op Vital signs reviewed, Patient's Cardiovascular Status Stable, Respiratory Function Stable, Patent Airway and No signs of Nausea or vomiting  Post-op Vital Signs: Reviewed and stable  Complications: No apparent anesthesia complications

## 2019-09-26 NOTE — Anesthesia Postprocedure Evaluation (Signed)
Anesthesia Post Note  Patient: Brent Kim  Procedure(s) Performed: CATARACT EXTRACTION PHACO AND INTRAOCULAR LENS PLACEMENT (IOC) LEFT PANOPTIX TORIC (Left Eye)  Patient location during evaluation: PACU Anesthesia Type: MAC Level of consciousness: awake and alert Pain management: pain level controlled Vital Signs Assessment: post-procedure vital signs reviewed and stable Respiratory status: spontaneous breathing, nonlabored ventilation, respiratory function stable and patient connected to nasal cannula oxygen Cardiovascular status: stable and blood pressure returned to baseline Postop Assessment: no apparent nausea or vomiting Anesthetic complications: no    Veda Canning

## 2019-09-27 ENCOUNTER — Encounter: Payer: Self-pay | Admitting: Ophthalmology

## 2019-10-10 ENCOUNTER — Encounter: Payer: Self-pay | Admitting: *Deleted

## 2019-10-10 ENCOUNTER — Encounter: Payer: Self-pay | Admitting: Anesthesiology

## 2019-10-10 ENCOUNTER — Other Ambulatory Visit: Payer: Self-pay

## 2019-10-13 ENCOUNTER — Other Ambulatory Visit: Admission: RE | Admit: 2019-10-13 | Payer: Medicare Other | Source: Ambulatory Visit

## 2019-10-17 ENCOUNTER — Ambulatory Visit: Admission: RE | Admit: 2019-10-17 | Payer: Medicare Other | Source: Home / Self Care | Admitting: Ophthalmology

## 2019-10-17 SURGERY — PHACOEMULSIFICATION, CATARACT, WITH IOL INSERTION
Anesthesia: Topical | Laterality: Right

## 2019-12-10 ENCOUNTER — Ambulatory Visit: Payer: Medicare Other

## 2019-12-11 ENCOUNTER — Other Ambulatory Visit: Payer: Medicare Other

## 2020-07-25 IMAGING — CR PORTABLE CHEST - 1 VIEW
1 series · 1 of 1 positions shown · non-contrast
Comparison: None.

CLINICAL DATA: Syncope

EXAM:
PORTABLE CHEST 1 VIEW

[dg chest port 1 view]
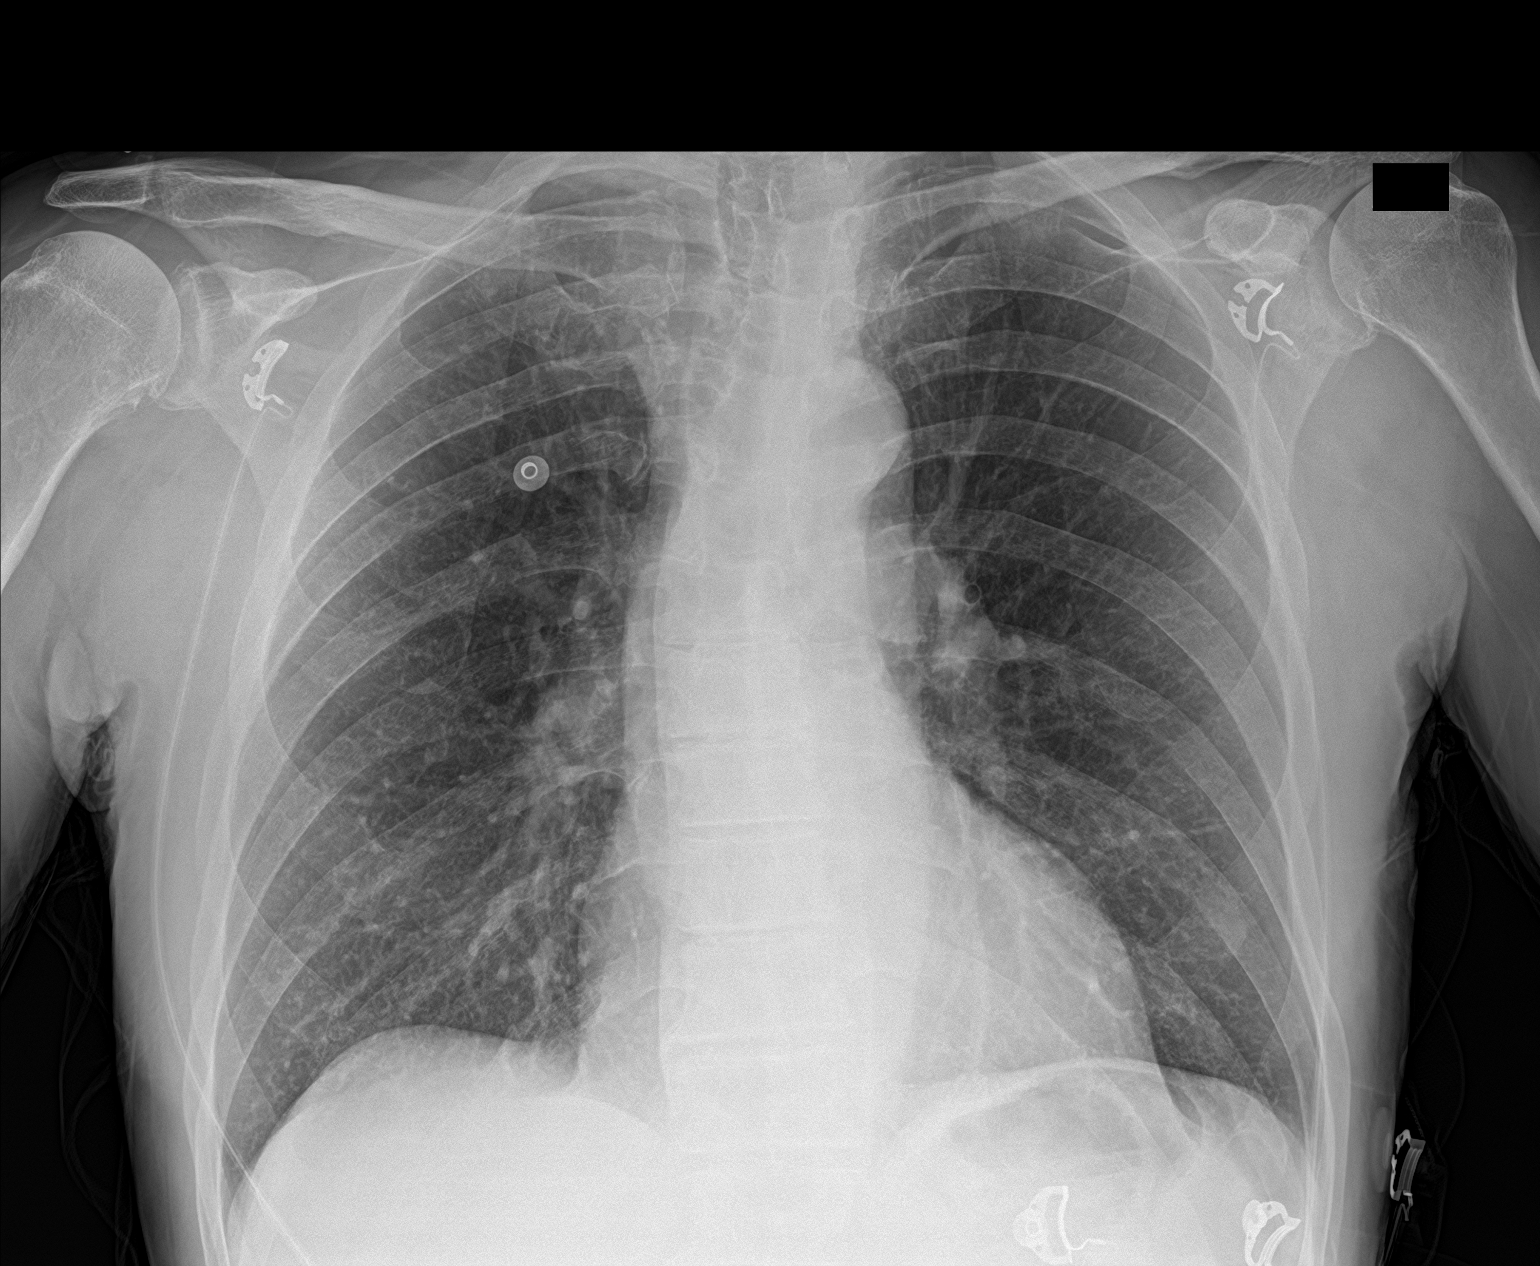

[1 of 1 positions shown; findings below may reference images not displayed]

FINDINGS: The heart size and mediastinal contours are within normal limits.
Both lungs are clear. The visualized skeletal structures are
unremarkable except for mild scoliosis of the thoracic spine.
IMPRESSION: No active disease.

## 2020-10-28 ENCOUNTER — Encounter: Payer: Self-pay | Admitting: Ophthalmology

## 2020-11-05 ENCOUNTER — Encounter: Payer: Self-pay | Admitting: Ophthalmology

## 2020-11-08 ENCOUNTER — Other Ambulatory Visit
Admission: RE | Admit: 2020-11-08 | Discharge: 2020-11-08 | Disposition: A | Discharge status: Home / Self Care | Payer: Medicare Other | Source: Ambulatory Visit | Attending: Ophthalmology | Admitting: Ophthalmology

## 2020-11-08 ENCOUNTER — Other Ambulatory Visit: Payer: Self-pay

## 2020-11-08 DIAGNOSIS — Z01812 Encounter for preprocedural laboratory examination: Secondary | ICD-10-CM | POA: Insufficient documentation

## 2020-11-08 DIAGNOSIS — Z20822 Contact with and (suspected) exposure to covid-19: Secondary | ICD-10-CM | POA: Insufficient documentation

## 2020-11-08 NOTE — Discharge Instructions (Signed)
General Anesthesia, Adult, Care After This sheet gives you information about how to care for yourself after your procedure. Your health care provider may also give you more specific instructions. If you have problems or questions, contact your health care provider. What can I expect after the procedure? After the procedure, the following side effects are common:  Pain or discomfort at the IV site.  Nausea.  Vomiting.  Sore throat.  Trouble concentrating.  Feeling cold or chills.  Weak or tired.  Sleepiness and fatigue.  Soreness and body aches. These side effects can affect parts of the body that were not involved in surgery. Follow these instructions at home:  For at least 24 hours after the procedure:  Have a responsible adult stay with you. It is important to have someone help care for you until you are awake and alert.  Rest as needed.  Do not: ? Participate in activities in which you could fall or become injured. ? Drive. ? Use heavy machinery. ? Drink alcohol. ? Take sleeping pills or medicines that cause drowsiness. ? Make important decisions or sign legal documents. ? Take care of children on your own. Eating and drinking  Follow any instructions from your health care provider about eating or drinking restrictions.  When you feel hungry, start by eating small amounts of foods that are soft and easy to digest (bland), such as toast. Gradually return to your regular diet.  Drink enough fluid to keep your urine pale yellow.  If you vomit, rehydrate by drinking water, juice, or clear broth. General instructions  If you have sleep apnea, surgery and certain medicines can increase your risk for breathing problems. Follow instructions from your health care provider about wearing your sleep device: ? Anytime you are sleeping, including during daytime naps. ? While taking prescription pain medicines, sleeping medicines, or medicines that make you drowsy.  Return to  your normal activities as told by your health care provider. Ask your health care provider what activities are safe for you.  Take over-the-counter and prescription medicines only as told by your health care provider.  If you smoke, do not smoke without supervision.  Keep all follow-up visits as told by your health care provider. This is important. Contact a health care provider if:  You have nausea or vomiting that does not get better with medicine.  You cannot eat or drink without vomiting.  You have pain that does not get better with medicine.  You are unable to pass urine.  You develop a skin rash.  You have a fever.  You have redness around your IV site that gets worse. Get help right away if:  You have difficulty breathing.  You have chest pain.  You have blood in your urine or stool, or you vomit blood. Summary  After the procedure, it is common to have a sore throat or nausea. It is also common to feel tired.  Have a responsible adult stay with you for the first 24 hours after general anesthesia. It is important to have someone help care for you until you are awake and alert.  When you feel hungry, start by eating small amounts of foods that are soft and easy to digest (bland), such as toast. Gradually return to your regular diet.  Drink enough fluid to keep your urine pale yellow.  Return to your normal activities as told by your health care provider. Ask your health care provider what activities are safe for you. This information is not   intended to replace advice given to you by your health care provider. Make sure you discuss any questions you have with your health care provider. Document Revised: 11/19/2017 Document Reviewed: 07/02/2017 Elsevier Patient Education  2020 Elsevier Inc.  Cataract Surgery, Care After This sheet gives you information about how to care for yourself after your procedure. Your health care provider may also give you more specific  instructions. If you have problems or questions, contact your health care provider. What can I expect after the procedure? After the procedure, it is common to have:  Itching.  Discomfort.  Fluid discharge.  Sensitivity to light and to touch.  Bruising in or around the eye.  Mild blurred vision. Follow these instructions at home: Eye care   Do not touch or rub your eyes.  Protect your eyes as told by your health care provider. You may be told to wear a protective eye shield or sunglasses.  Do not put a contact lens into the affected eye or eyes until your health care provider approves.  Keep the area around your eye clean and dry: ? Avoid swimming. ? Do not allow water to hit you directly in the face while showering. ? Keep soap and shampoo out of your eyes.  Check your eye every day for signs of infection. Watch for: ? Redness, swelling, or pain. ? Fluid, blood, or pus. ? Warmth. ? A bad smell. ? Vision that is getting worse. ? Sensitivity that is getting worse. Activity  Do not drive for 24 hours if you were given a sedative during your procedure.  Avoid strenuous activities, such as playing contact sports, for as long as told by your health care provider.  Do not drive or use heavy machinery until your health care provider approves.  Do not bend or lift heavy objects. Bending increases pressure in the eye. You can walk, climb stairs, and do light household chores.  Ask your health care provider when you can return to work. If you work in a dusty environment, you may be advised to wear protective eyewear for a period of time. General instructions  Take or apply over-the-counter and prescription medicines only as told by your health care provider. This includes eye drops.  Keep all follow-up visits as told by your health care provider. This is important. Contact a health care provider if:  You have increased bruising around your eye.  You have pain that is  not helped with medicine.  You have a fever.  You have redness, swelling, or pain in your eye.  You have fluid, blood, or pus coming from your incision.  Your vision gets worse.  Your sensitivity to light gets worse. Get help right away if:  You have sudden loss of vision.  You see flashes of light or spots (floaters).  You have severe eye pain.  You develop nausea or vomiting. Summary  After your procedure, it is common to have itching, discomfort, bruising, fluid discharge, or sensitivity to light.  Follow instructions from your health care provider about caring for your eye after the procedure.  Do not rub your eye after the procedure. You may need to wear eye protection or sunglasses. Do not wear contact lenses. Keep the area around your eye clean and dry.  Avoid activities that require a lot of effort. These include playing sports and lifting heavy objects.  Contact a health care provider if you have increased bruising, pain that does not go away, or a fever. Get help right   away if you suddenly lose your vision, see flashes of light or spots, or have severe pain in the eye. This information is not intended to replace advice given to you by your health care provider. Make sure you discuss any questions you have with your health care provider. Document Revised: 09/12/2019 Document Reviewed: 05/16/2018 Elsevier Patient Education  2020 Elsevier Inc.  

## 2020-11-09 LAB — SARS CORONAVIRUS 2 (TAT 6-24 HRS): SARS Coronavirus 2: NEGATIVE

## 2020-11-12 ENCOUNTER — Ambulatory Visit: Payer: Medicare Other | Admitting: Anesthesiology

## 2020-11-12 ENCOUNTER — Encounter: Payer: Self-pay | Admitting: Ophthalmology

## 2020-11-12 ENCOUNTER — Encounter
Admission: RE | Disposition: A | Discharge status: Home / Self Care | Payer: Self-pay | Source: Home / Self Care | Attending: Ophthalmology

## 2020-11-12 ENCOUNTER — Ambulatory Visit
Admission: RE | Admit: 2020-11-12 | Discharge: 2020-11-12 | Disposition: A | Discharge status: Home / Self Care | Payer: Medicare Other | Attending: Ophthalmology | Admitting: Ophthalmology

## 2020-11-12 ENCOUNTER — Other Ambulatory Visit: Payer: Self-pay

## 2020-11-12 DIAGNOSIS — H2511 Age-related nuclear cataract, right eye: Secondary | ICD-10-CM | POA: Insufficient documentation

## 2020-11-12 DIAGNOSIS — Z79899 Other long term (current) drug therapy: Secondary | ICD-10-CM | POA: Insufficient documentation

## 2020-11-12 DIAGNOSIS — Z7982 Long term (current) use of aspirin: Secondary | ICD-10-CM | POA: Insufficient documentation

## 2020-11-12 HISTORY — PX: CATARACT EXTRACTION W/PHACO: SHX586

## 2020-11-12 SURGERY — PHACOEMULSIFICATION, CATARACT, WITH IOL INSERTION
Anesthesia: Monitor Anesthesia Care | Site: Eye | Laterality: Right

## 2020-11-12 MED ORDER — ARMC OPHTHALMIC DILATING DROPS
1.0000 "application " | OPHTHALMIC | Status: DC | PRN
  Administered 2020-11-12 (×3): 1 via OPHTHALMIC

## 2020-11-12 MED ORDER — FENTANYL CITRATE (PF) 100 MCG/2ML IJ SOLN
25.0000 ug | INTRAMUSCULAR | Status: DC | PRN
Start: 2020-11-12 — End: 2020-11-12

## 2020-11-12 MED ORDER — PROMETHAZINE HCL 25 MG/ML IJ SOLN: 6.2500 mg | INTRAMUSCULAR | Status: DC | PRN

## 2020-11-12 MED ORDER — EPINEPHRINE PF 1 MG/ML IJ SOLN
INTRAOCULAR | Status: DC | PRN
  Administered 2020-11-12: 10:00:00 55 mL via OPHTHALMIC

## 2020-11-12 MED ORDER — OXYCODONE HCL 5 MG PO TABS: 5.0000 mg | ORAL_TABLET | Freq: Once | ORAL | Status: DC | PRN

## 2020-11-12 MED ORDER — TETRACAINE HCL 0.5 % OP SOLN
1.0000 [drp] | OPHTHALMIC | Status: DC | PRN
  Administered 2020-11-12 (×3): 1 [drp] via OPHTHALMIC

## 2020-11-12 MED ORDER — OXYCODONE HCL 5 MG/5ML PO SOLN: 5.0000 mg | Freq: Once | ORAL | Status: DC | PRN

## 2020-11-12 MED ORDER — MOXIFLOXACIN HCL 0.5 % OP SOLN
OPHTHALMIC | Status: DC | PRN
  Administered 2020-11-12: 0.2 mL via OPHTHALMIC

## 2020-11-12 MED ORDER — FENTANYL CITRATE (PF) 100 MCG/2ML IJ SOLN
INTRAMUSCULAR | Status: DC | PRN
  Administered 2020-11-12: 50 ug via INTRAVENOUS

## 2020-11-12 MED ORDER — BRIMONIDINE TARTRATE-TIMOLOL 0.2-0.5 % OP SOLN
OPHTHALMIC | Status: DC | PRN
  Administered 2020-11-12: 1 [drp] via OPHTHALMIC

## 2020-11-12 MED ORDER — NA CHONDROIT SULF-NA HYALURON 40-17 MG/ML IO SOLN
INTRAOCULAR | Status: DC | PRN
  Administered 2020-11-12: 1 mL via INTRAOCULAR

## 2020-11-12 MED ORDER — MIDAZOLAM HCL 2 MG/2ML IJ SOLN
INTRAMUSCULAR | Status: DC | PRN
  Administered 2020-11-12: 1 mg via INTRAVENOUS

## 2020-11-12 MED ORDER — LIDOCAINE HCL (PF) 2 % IJ SOLN
INTRAOCULAR | Status: DC | PRN
  Administered 2020-11-12: 2 mL

## 2020-11-12 MED ORDER — MEPERIDINE HCL 25 MG/ML IJ SOLN: 6.2500 mg | INTRAMUSCULAR | Status: DC | PRN

## 2020-11-12 MED ORDER — LACTATED RINGERS IV SOLN: INTRAVENOUS | Status: DC

## 2020-11-12 SURGICAL SUPPLY — 21 items
CANNULA ANT/CHMB 27G (MISCELLANEOUS) ×2 IMPLANT
CANNULA ANT/CHMB 27GA (MISCELLANEOUS) ×6 IMPLANT
GLOVE SURG LX 8.0 MICRO (GLOVE) ×2
GLOVE SURG LX STRL 8.0 MICRO (GLOVE) ×1 IMPLANT
GLOVE SURG TRIUMPH 8.0 PF LTX (GLOVE) ×3 IMPLANT
GOWN STRL REUS W/ TWL LRG LVL3 (GOWN DISPOSABLE) ×2 IMPLANT
GOWN STRL REUS W/TWL LRG LVL3 (GOWN DISPOSABLE) ×6
LENS IOL EYHANCE TORIC II 22.5 ×3 IMPLANT
LENS IOL EYHANCE TRC 375 22.5 IMPLANT
LENS IOL EYHNC TORIC 375 22.5 ×1 IMPLANT
MARKER SKIN DUAL TIP RULER LAB (MISCELLANEOUS) ×3 IMPLANT
NDL FILTER BLUNT 18X1 1/2 (NEEDLE) ×1 IMPLANT
NEEDLE FILTER BLUNT 18X 1/2SAF (NEEDLE) ×2
NEEDLE FILTER BLUNT 18X1 1/2 (NEEDLE) ×1 IMPLANT
PACK EYE AFTER SURG (MISCELLANEOUS) ×3 IMPLANT
PACK OPTHALMIC (MISCELLANEOUS) ×3 IMPLANT
PACK PORFILIO (MISCELLANEOUS) ×3 IMPLANT
SYR 3ML LL SCALE MARK (SYRINGE) ×3 IMPLANT
SYR TB 1ML LUER SLIP (SYRINGE) ×3 IMPLANT
WATER STERILE IRR 250ML POUR (IV SOLUTION) ×3 IMPLANT
WIPE NON LINTING 3.25X3.25 (MISCELLANEOUS) ×3 IMPLANT

## 2020-11-12 NOTE — Anesthesia Procedure Notes (Signed)
Procedure Name: MAC Date/Time: 11/12/2020 10:05 AM Performed by: Cameron Ali, CRNA Pre-anesthesia Checklist: Patient identified, Emergency Drugs available, Suction available, Timeout performed and Patient being monitored Patient Re-evaluated:Patient Re-evaluated prior to induction Oxygen Delivery Method: Nasal cannula Placement Confirmation: positive ETCO2

## 2020-11-12 NOTE — Op Note (Signed)
PREOPERATIVE DIAGNOSIS:  Nuclear sclerotic cataract of the right eye.   POSTOPERATIVE DIAGNOSIS:  Nuclear sclerotic cataract of the right eye.   OPERATIVE PROCEDURE: Procedure(s): CATARACT EXTRACTION PHACO AND INTRAOCULAR LENS PLACEMENT (IOC) RIGHT EYHANCE TORIC 6.52 00:43.9   SURGEON:  Galen Manila, MD.   ANESTHESIA: 1.      Managed anesthesia care. 2.     0.87ml of Shugarcaine was instilled following the paracentesis  Anesthesiologist: Aldine Contes, MD CRNA: Maree Krabbe, CRNA  COMPLICATIONS:  None.   TECHNIQUE:   Stop and chop    DESCRIPTION OF PROCEDURE:  The patient was examined and consented in the preoperative holding area where the aforementioned topical anesthesia was applied to the right eye.  The patient was brought back to the Operating Room where he was sat upright on the gurney and given a target to fixate upon while the eye was marked at the 3:00 and 9:00 position.  The patient was then reclined on the operating table.  The eye was prepped and draped in the usual sterile ophthalmic fashion and a lid speculum was placed. A paracentesis was created with the side port blade and the anterior chamber was filled with viscoelastic. A near clear corneal incision was performed with the steel keratome. A continuous curvilinear capsulorrhexis was performed with a cystotome followed by the capsulorrhexis forceps. Hydrodissection and hydrodelineation were carried out with BSS on a blunt cannula. The lens was removed in a stop and chop technique and the remaining cortical material was removed with the irrigation-aspiration handpiece. The eye was inflated with viscoelastic and the DIU  lens  was placed in the eye and rotated to within a few degrees of the predetermined orientation.  The remaining viscoelastic was removed from the eye.  The Sinskey hook was used to rotate the toric lens into its final resting place at 180 degrees.  0. The eye was inflated to a physiologic pressure and  found to be watertight. 0.6ml of Vigamox was placed in the anterior chamber.  The eye was dressed with Vigamox. The patient was given protective glasses to wear throughout the day and a shield with which to sleep tonight. The patient was also given drops with which to begin a drop regimen today and will follow-up with me in one day. Implant Name Type Inv. Item Serial No. Manufacturer Lot No. LRB No. Used Action  Tecnis Eyhance Toric II IOL 22.5 D Intraocular Lens  [id number] Laural Benes [employer] J[employer]ON  Right 1 Implanted   Procedure(s): CATARACT EXTRACTION PHACO AND INTRAOCULAR LENS PLACEMENT (IOC) RIGHT EYHANCE TORIC 6.52 00:43.9 (Right)  Electronically signed: Galen Manila 11/12/2020 10:27 AM

## 2020-11-12 NOTE — Anesthesia Postprocedure Evaluation (Signed)
Anesthesia Post Note  Patient: Brent Kim  Procedure(s) Performed: CATARACT EXTRACTION PHACO AND INTRAOCULAR LENS PLACEMENT (IOC) RIGHT EYHANCE TORIC 6.52 00:43.9 (Right Eye)     Patient location during evaluation: PACU Anesthesia Type: MAC Level of consciousness: awake and alert Pain management: pain level controlled Vital Signs Assessment: post-procedure vital signs reviewed and stable Respiratory status: spontaneous breathing, nonlabored ventilation, respiratory function stable and patient connected to nasal cannula oxygen Cardiovascular status: stable and blood pressure returned to baseline Postop Assessment: no apparent nausea or vomiting Anesthetic complications: no   No complications documented.  Cacie Gaskins, Glade Stanford

## 2020-11-12 NOTE — Transfer of Care (Signed)
Immediate Anesthesia Transfer of Care Note  Patient: Brent Kim  Procedure(s) Performed: CATARACT EXTRACTION PHACO AND INTRAOCULAR LENS PLACEMENT (IOC) RIGHT EYHANCE TORIC 6.52 00:43.9 (Right Eye)  Patient Location: PACU  Anesthesia Type: MAC  Level of Consciousness: awake, alert  and patient cooperative  Airway and Oxygen Therapy: Patient Spontanous Breathing and Patient connected to supplemental oxygen  Post-op Assessment: Post-op Vital signs reviewed, Patient's Cardiovascular Status Stable, Respiratory Function Stable, Patent Airway and No signs of Nausea or vomiting  Post-op Vital Signs: Reviewed and stable  Complications: No complications documented.

## 2020-11-12 NOTE — Anesthesia Preprocedure Evaluation (Signed)
Anesthesia Evaluation  Patient identified by MRN, date of birth, ID band Patient awake    Reviewed: Allergy & Precautions, H&P , NPO status , Patient's Chart, lab work & pertinent test results, reviewed documented beta blocker date and time   Airway Mallampati: II  TM Distance: >3 FB Neck ROM: full    Dental no notable dental hx.    Pulmonary pneumonia,    Pulmonary exam normal breath sounds clear to auscultation       Cardiovascular Exercise Tolerance: Good negative cardio ROS   Rhythm:regular Rate:Normal     Neuro/Psych negative neurological ROS  negative psych ROS   GI/Hepatic Neg liver ROS, GERD  ,  Endo/Other  negative endocrine ROS  Renal/GU negative Renal ROS  negative genitourinary   Musculoskeletal   Abdominal   Peds  Hematology negative hematology ROS (+)   Anesthesia Other Findings   Reproductive/Obstetrics negative OB ROS                             Anesthesia Physical Anesthesia Plan  ASA: II  Anesthesia Plan: MAC   Post-op Pain Management:    Induction:   PONV Risk Score and Plan: 1  Airway Management Planned:   Additional Equipment:   Intra-op Plan:   Post-operative Plan:   Informed Consent: I have reviewed the patients History and Physical, chart, labs and discussed the procedure including the risks, benefits and alternatives for the proposed anesthesia with the patient or authorized representative who has indicated his/her understanding and acceptance.     Dental Advisory Given  Plan Discussed with: CRNA  Anesthesia Plan Comments:         Anesthesia Quick Evaluation

## 2020-11-12 NOTE — H&P (Signed)
[facility]   Primary Care Physician:  Jerl Mina, MD Ophthalmologist: Dr. Druscilla Brownie  Pre-Procedure History & Physical: HPI:  Brent Kim is a 79 y.o. male here for cataract surgery.   Past Medical History:  Diagnosis Date  . Erectile dysfunction   . GERD (gastroesophageal reflux disease)   . Grover's disease   . Hyperlipemia   . Pneumonia   . Urethral stricture     Past Surgical History:  Procedure Laterality Date  . CATARACT EXTRACTION W/PHACO Left 09/26/2019   Procedure: CATARACT EXTRACTION PHACO AND INTRAOCULAR LENS PLACEMENT (IOC) LEFT PANOPTIX TORIC;  Surgeon: Galen Manila, MD;  Location: [facility];  Service: Ophthalmology;  Laterality: Left;  0:46 16.3% 7.54  . COLONOSCOPY WITH PROPOFOL N/A 07/15/2015   Procedure: COLONOSCOPY WITH PROPOFOL;  Surgeon: Scot Jun, MD;  Location: [facility] ENDOSCOPY;  Service: Endoscopy;  Laterality: N/A;  . CYSTOSCOPY WITH DIRECT VISION INTERNAL URETHROTOMY    . CYSTOURETHROSCOPY    . DESTRUCTION LESION PENILE    . VASECTOMY      Prior to Admission medications   Medication Sig Start Date End Date Taking? Authorizing Provider  aspirin 81 MG tablet Take 81 mg by mouth daily.   Yes [provider]  cholecalciferol (VITAMIN D) 1000 UNITS tablet Take 1,000 Units by mouth daily.   Yes [provider]  loratadine (CLARITIN) 10 MG tablet Take 10 mg by mouth daily.   Yes [provider]  Multiple Vitamin (MULTIVITAMIN) tablet Take 1 tablet by mouth daily.   Yes [provider]  omeprazole (PRILOSEC) 20 MG capsule Take 20 mg by mouth daily.   Yes [provider]  sildenafil (VIAGRA) 100 MG tablet Take 100 mg by mouth daily as needed for erectile dysfunction.    Yes [provider]  Testosterone 10 MG/ACT (2%) GEL Place 4 Pump onto the skin daily.    Yes [provider]  traZODone (DESYREL) 50 MG tablet Take 50 mg by mouth at bedtime.   Yes [provider]    Allergies as of 09/13/2020  . (No Known Allergies)    Family History  Problem Relation Age of Onset  . Kidney cancer Mother   . Lung cancer Mother   . Stroke Father     Social History   Socioeconomic History  . Marital status: Married    Spouse name: Not on file  . Number of children: Not on file  . Years of education: Not on file  . Highest education level: Not on file  Occupational History  . Not on file  Tobacco Use  . Smoking status: Never Smoker  . Smokeless tobacco: Never Used  Vaping Use  . Vaping Use: Never used  Substance and Sexual Activity  . Alcohol use: Yes    Alcohol/week: 14.0 standard drinks    Types: 14 Standard drinks or equivalent per week    Comment: 1 burboun, 1 glass of wine  . Drug use: Not on file  . Sexual activity: Not on file  Other Topics Concern  . Not on file  Social History Narrative  . Not on file   Social Determinants of Health   Financial Resource Strain: Not on file  Food Insecurity: Not on file  Transportation Needs: Not on file  Physical Activity: Not on file  Stress: Not on file  Social Connections: Not on file  Intimate Partner Violence: Not on file    Review of Systems: See HPI, otherwise negative ROS  Physical  Exam: BP 128/81   Pulse 63   Temp (!) 97.1 F (36.2 C) (Temporal)   Resp 16   Ht 5\' 8"  (1.727 m)   Wt 67.1 kg   SpO2 100%   BMI 22.50 kg/m  General:   Alert,  pleasant and cooperative in NAD Head:  Normocephalic and atraumatic. Respiratory:  Normal work of breathing. Heart:  Regular rate and rhythm.  Impression/Plan: Brent Kim is here for cataract surgery.  Risks, benefits, limitations, and alternatives regarding cataract surgery have been reviewed with the patient.  Questions have been answered.  All parties agreeable.   Kerrin Champagne, MD  11/12/2020, 9:59 AM

## 2020-11-13 ENCOUNTER — Encounter: Payer: Self-pay | Admitting: Ophthalmology

## 2022-02-04 ENCOUNTER — Encounter: Payer: Self-pay | Admitting: Urology

## 2022-02-04 ENCOUNTER — Ambulatory Visit (INDEPENDENT_AMBULATORY_CARE_PROVIDER_SITE_OTHER): Payer: Medicare Other | Admitting: Urology

## 2022-02-04 ENCOUNTER — Other Ambulatory Visit: Payer: Self-pay

## 2022-02-04 VITALS — BP 134/75 | HR 69 | Ht 68.0 in | Wt 145.0 lb

## 2022-02-04 DIAGNOSIS — N400 Enlarged prostate without lower urinary tract symptoms: Secondary | ICD-10-CM

## 2022-02-04 NOTE — Progress Notes (Signed)
? ?02/04/2022 ?1:55 PM  ? ?Brent Kim ?1941/11/17 ?[id number] ? ?Referring provider: Jerl Mina, MD ?[address] [place] ?[facility] ?[place],  [place] [address] ? ?Chief Complaint  ?Patient presents with  ? Elevated PSA  ? ? ?HPI: ?Navy Belay is a 81 y.o. male referred for evaluation of an elevated PSA. ? ?PSA drawn 12/2021 was 4.02 ?Prior PSA trend: ? ? ? ?No dysuria or gross hematuria ?History bulbar urethral stricture previously followed by me and at [facility] ?Also had history high-grade squamous intraepithelial neoplasia glans in 2014 and was followed for 5 years and free of disease and released from regular follow-up ? ?PMH: ?Past Medical History:  ?Diagnosis Date  ? Erectile dysfunction   ? GERD (gastroesophageal reflux disease)   ? Grover's disease   ? Hyperlipemia   ? Pneumonia   ? Urethral stricture   ? ? ?Surgical History: ?Past Surgical History:  ?Procedure Laterality Date  ? CATARACT EXTRACTION W/PHACO Left 09/26/2019  ? Procedure: CATARACT EXTRACTION PHACO AND INTRAOCULAR LENS PLACEMENT (IOC) LEFT PANOPTIX TORIC;  Surgeon: Galen Manila, MD;  Location: [facility];  Service: Ophthalmology;  Laterality: Left;  0:46 ?16.3% ?7.54  ? CATARACT EXTRACTION W/PHACO Right 11/12/2020  ? Procedure: CATARACT EXTRACTION PHACO AND INTRAOCULAR LENS PLACEMENT (IOC) RIGHT EYHANCE TORIC 6.52 00:43.9;  Surgeon: Galen Manila, MD;  Location: [facility];  Service: Ophthalmology;  Laterality: Right;  ? COLONOSCOPY WITH PROPOFOL N/A 07/15/2015  ? Procedure: COLONOSCOPY WITH PROPOFOL;  Surgeon: Scot Jun, MD;  Location: [facility];  Service: Endoscopy;  Laterality: N/A;  ? CYSTOSCOPY WITH DIRECT VISION INTERNAL URETHROTOMY    ? CYSTOURETHROSCOPY    ? DESTRUCTION LESION PENILE    ? VASECTOMY    ? ? ?Home Medications:  ?Allergies as of 02/04/2022   ? ?   Reactions  ? Balsam Other (See Comments)  ? Other reaction(s): Other (See Comments) ?Positive patch test ?Positive patch test  ?  Cetearyl Other (See Comments)  ? Other reaction(s): Other (See Comments) ?Positive patch test ?Positive patch test  ? Decyl Glucoside Other (See Comments)  ? Other reaction(s): Other (See Comments) ?Positive patch test ?Positive patch test  ? Linalool Other (See Comments)  ? Other reaction(s): Other (See Comments) ?Positive patch test ?Positive patch test  ? ?  ? ?  ?Medication List  ?  ? ?  ? Accurate as of February 04, 2022  1:55 PM. If you have any questions, ask your nurse or doctor.  ?  ?  ? ?  ? ?STOP taking these medications   ? ?aspirin 81 MG tablet ?Stopped by: Riki Altes, MD ?  ? ?  ? ?TAKE these medications   ? ?cholecalciferol 1000 units tablet ?Commonly known as: VITAMIN D ?Take 1,000 Units by mouth daily. ?  ?loratadine 10 MG tablet ?Commonly known as: CLARITIN ?Take 10 mg by mouth daily. ?  ?multivitamin tablet ?Take 1 tablet by mouth daily. ?  ?omeprazole 20 MG capsule ?Commonly known as: PRILOSEC ?Take 20 mg by mouth daily. ?  ?sildenafil 100 MG tablet ?Commonly known as: VIAGRA ?Take 100 mg by mouth daily as needed for erectile dysfunction. ?  ?Testosterone 10 MG/ACT (2%) Gel ?Place 4 Pump onto the skin daily. ?  ?traZODone 50 MG tablet ?Commonly known as: DESYREL ?Take 50 mg by mouth at bedtime. ?  ? ?  ? ? ?Allergies:  ?Allergies  ?Allergen Reactions  ? Balsam Other (See Comments)  ?  Other reaction(s): Other (See Comments) ?Positive  patch test ?Positive patch test ?  ? Cetearyl Other (See Comments)  ?  Other reaction(s): Other (See Comments) ?Positive patch test ?Positive patch test ?  ? Decyl Glucoside Other (See Comments)  ?  Other reaction(s): Other (See Comments) ?Positive patch test ?Positive patch test ?  ? Linalool Other (See Comments)  ?  Other reaction(s): Other (See Comments) ?Positive patch test ?Positive patch test ?  ? ? ?Family History: ?Family History  ?Problem Relation Age of Onset  ? Kidney cancer Mother   ? Lung cancer Mother   ? Stroke Father   ? ? ?Social History:  reports  that he has never smoked. He has never used smokeless tobacco. He reports current alcohol use of about 14.0 standard drinks per week. No history on file for drug use. ? ? ?Physical Exam: ?BP 134/75   Pulse 69   Ht 5\' 8"  (1.727 m)   Wt 145 lb (65.8 kg)   BMI 22.05 kg/m?   ?Constitutional:  Alert and oriented, No acute distress. ?HEENT: [place] AT, moist mucus membranes.  Trachea midline, no masses. ?Cardiovascular: No clubbing, cyanosis, or edema. ?Respiratory: Normal respiratory effort, no increased work of breathing. ?GU: Prostate 50 g, smooth without nodules ?Psychiatric: Normal mood and affect. ? ? ?Assessment & Plan:   ?81 y.o. male with a PSA of 4.02.  Based on prior PSA readings it is well within the range of of 0.75 increase per year ?We also discussed age-specific guidelines and for his age group a PSA of 6.5 or less is considered normal ?Recent prostate cancer screening recommendations were also discussed and that regular prostate cancer screening is recommended between the ages of 44-69 ?He would like to continue annual follow-up ? ? ?51-72, MD ? ?[facility] ?[address], [address] ?[place], [place] ?[phone] ? ?

## 2022-02-05 ENCOUNTER — Ambulatory Visit: Payer: Medicare Other | Admitting: Urology

## 2022-08-28 ENCOUNTER — Encounter: Payer: Self-pay | Admitting: Urology

## 2022-09-10 ENCOUNTER — Other Ambulatory Visit: Payer: Self-pay | Admitting: Internal Medicine

## 2022-09-10 DIAGNOSIS — R079 Chest pain, unspecified: Secondary | ICD-10-CM

## 2022-09-11 ENCOUNTER — Telehealth (HOSPITAL_COMMUNITY): Payer: Self-pay | Admitting: *Deleted

## 2022-09-11 ENCOUNTER — Telehealth (HOSPITAL_COMMUNITY): Payer: Self-pay | Admitting: Emergency Medicine

## 2022-09-11 NOTE — Telephone Encounter (Signed)
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

## 2022-09-11 NOTE — Telephone Encounter (Signed)
Attempted to call patient regarding upcoming cardiac CT appointment. °Left message on voicemail with name and callback number ° °Shain Pauwels RN Navigator Cardiac Imaging °[facility] °[phone] Office °[phone] Cell ° °

## 2022-09-14 ENCOUNTER — Ambulatory Visit
Admission: RE | Admit: 2022-09-14 | Discharge: 2022-09-14 | Disposition: A | Discharge status: Home / Self Care | Payer: Medicare Other | Source: Ambulatory Visit | Attending: Internal Medicine | Admitting: Internal Medicine

## 2022-09-14 DIAGNOSIS — R079 Chest pain, unspecified: Secondary | ICD-10-CM | POA: Insufficient documentation

## 2022-09-14 DIAGNOSIS — I2510 Atherosclerotic heart disease of native coronary artery without angina pectoris: Secondary | ICD-10-CM | POA: Insufficient documentation

## 2022-09-14 MED ORDER — IOHEXOL 350 MG/ML SOLN
100.0000 mL | Freq: Once | INTRAVENOUS | Status: AC | PRN
  Administered 2022-09-14: 100 mL via INTRAVENOUS

## 2022-09-14 MED ORDER — NITROGLYCERIN 0.4 MG SL SUBL
0.8000 mg | SUBLINGUAL_TABLET | Freq: Once | SUBLINGUAL | Status: AC
  Administered 2022-09-14: 0.8 mg via SUBLINGUAL

## 2022-09-14 MED ORDER — METOPROLOL TARTRATE 5 MG/5ML IV SOLN: 10.0000 mg | Freq: Once | INTRAVENOUS | Status: DC

## 2022-09-14 NOTE — Progress Notes (Signed)
Patient tolerated procedure well. Ambulate w/o difficulty. Denies any lightheadedness or being dizzy. Pt denies any pain at this time. Sitting in chair, pt is encouraged to drink additional water throughout the day and reason explained to patient. Patient verbalized understanding and all questions answered. ABC intact. No further needs at this time. Discharge from procedure area w/o issues.  

## 2022-09-22 LAB — POCT I-STAT CREATININE: Creatinine, Ser: 1 mg/dL (ref 0.61–1.24)

## 2023-02-05 ENCOUNTER — Ambulatory Visit: Payer: Medicare Other | Admitting: Urology

## 2023-02-12 ENCOUNTER — Encounter: Payer: Self-pay | Admitting: Urology

## 2023-02-12 ENCOUNTER — Ambulatory Visit (INDEPENDENT_AMBULATORY_CARE_PROVIDER_SITE_OTHER): Payer: Medicare Other | Admitting: Urology

## 2023-02-12 VITALS — BP 119/81 | HR 80 | Ht 69.0 in | Wt 145.0 lb

## 2023-02-12 DIAGNOSIS — R972 Elevated prostate specific antigen [PSA]: Secondary | ICD-10-CM | POA: Diagnosis not present

## 2023-02-12 DIAGNOSIS — N529 Male erectile dysfunction, unspecified: Secondary | ICD-10-CM | POA: Diagnosis not present

## 2023-02-12 NOTE — Progress Notes (Signed)
02/12/2023 1:48 PM   Brent Kim Apr 07, 1941 [id number]  Referring provider: [place] Pink, MD [address] [place],  [place] [address]  No chief complaint on file.   HPI: 82 y.o. male presents for annual follow-up.  Seen last year for PSA of 4.02 Stable LUTS; does note some frequency, urgency and decreased urinary stream No dysuria or gross hematuria Did want to discuss difficulty maintaining an erection.  He is able to achieve erections firm enough for penetration with a PDE 5 inhibitor however has difficulty maintaining the erection.    PMH: Past Medical History:  Diagnosis Date   Erectile dysfunction    GERD (gastroesophageal reflux disease)    Grover's disease    Hyperlipemia    Pneumonia    Urethral stricture     Surgical History: Past Surgical History:  Procedure Laterality Date   CATARACT EXTRACTION W/PHACO Left 09/26/2019   Procedure: CATARACT EXTRACTION PHACO AND INTRAOCULAR LENS PLACEMENT ([place]) LEFT PANOPTIX TORIC;  Surgeon: Birder Robson, MD;  Location: [facility];  Service: Ophthalmology;  Laterality: Left;  0:46 16.3% 7.54   CATARACT EXTRACTION W/PHACO Right 11/12/2020   Procedure: CATARACT EXTRACTION PHACO AND INTRAOCULAR LENS PLACEMENT (IOC) RIGHT EYHANCE TORIC 6.52 00:43.9;  Surgeon: Birder Robson, MD;  Location: [facility];  Service: Ophthalmology;  Laterality: Right;   COLONOSCOPY WITH PROPOFOL N/A 07/15/2015   Procedure: COLONOSCOPY WITH PROPOFOL;  Surgeon: Manya Silvas, MD;  Location: [facility];  Service: Endoscopy;  Laterality: N/A;   CYSTOSCOPY WITH DIRECT VISION INTERNAL URETHROTOMY     CYSTOURETHROSCOPY     DESTRUCTION LESION PENILE     VASECTOMY      Home Medications:  Allergies as of 02/12/2023       Reactions   Balsam Other (See Comments)   Other reaction(s): Other (See Comments) Positive patch test Positive patch test   Cetearyl Other (See Comments)   Other reaction(s):  Other (See Comments) Positive patch test Positive patch test   Decyl Glucoside Other (See Comments)   Other reaction(s): Other (See Comments) Positive patch test Positive patch test   Linalool Other (See Comments)   Other reaction(s): Other (See Comments) Positive patch test Positive patch test        Medication List        Accurate as of February 12, 2023  1:48 PM. If you have any questions, ask your nurse or doctor.          cholecalciferol 1000 units tablet Commonly known as: VITAMIN D Take 1,000 Units by mouth daily.   loratadine 10 MG tablet Commonly known as: CLARITIN Take 10 mg by mouth daily.   multivitamin tablet Take 1 tablet by mouth daily.   omeprazole 20 MG capsule Commonly known as: PRILOSEC Take 20 mg by mouth daily.   sildenafil 100 MG tablet Commonly known as: VIAGRA Take 100 mg by mouth daily as needed for erectile dysfunction.   Testosterone 10 MG/ACT (2%) Gel Place 4 Pump onto the skin daily.   traZODone 50 MG tablet Commonly known as: DESYREL Take 50 mg by mouth at bedtime.        Allergies:  Allergies  Allergen Reactions   Balsam Other (See Comments)    Other reaction(s): Other (See Comments) Positive patch test Positive patch test    Cetearyl Other (See Comments)    Other reaction(s): Other (See Comments) Positive patch test Positive patch test    Decyl Glucoside Other (See Comments)    Other reaction(s):  Other (See Comments) Positive patch test Positive patch test    Linalool Other (See Comments)    Other reaction(s): Other (See Comments) Positive patch test Positive patch test     Family History: Family History  Problem Relation Age of Onset   Kidney cancer Mother    Lung cancer Mother    Stroke Father     Social History:  reports that he has never smoked. He has never used smokeless tobacco. He reports current alcohol use of about 14.0 standard drinks of alcohol per week. No history on file for drug  use.   Physical Exam: BP 119/81   Pulse 80   Ht 5\' 9"  (1.753 m)   Wt 145 lb (65.8 kg)   BMI 21.41 kg/m   Constitutional:  Alert and oriented, No acute distress. HEENT: [place] AT Respiratory: Normal respiratory effort, no increased work of breathing. GU: Prostate 50 g, smooth without nodules Psychiatric: Normal mood and affect.   Assessment & Plan:    1.  Elevated PSA Mild PSA elevation by strict criteria Prostate cancer screening guidelines were discussed last year He would like to have his PSA repeated today  2.  Erectile dysfunction Difficulty maintaining an erection. Discussed the possibility of venoocclusive disease and he was provided literature on a venous compression band (VenoSeal)   Abbie Sons, MD  [place] [address], [place], [place] [address] [phone]

## 2023-02-12 NOTE — Patient Instructions (Signed)
[facility] also on [place].

## 2023-02-13 LAB — PSA: Prostate Specific Ag, Serum: 5.7 ng/mL — ABNORMAL HIGH (ref 0.0–4.0)

## 2023-02-14 ENCOUNTER — Other Ambulatory Visit: Payer: Self-pay | Admitting: Urology

## 2023-02-14 DIAGNOSIS — R972 Elevated prostate specific antigen [PSA]: Secondary | ICD-10-CM

## 2023-02-27 ENCOUNTER — Ambulatory Visit
Admission: RE | Admit: 2023-02-27 | Discharge: 2023-02-27 | Disposition: A | Discharge status: Home / Self Care | Payer: Medicare Other | Source: Ambulatory Visit | Attending: Urology | Admitting: Urology

## 2023-02-27 DIAGNOSIS — R972 Elevated prostate specific antigen [PSA]: Secondary | ICD-10-CM | POA: Diagnosis present

## 2023-02-27 MED ORDER — GADOBUTROL 1 MMOL/ML IV SOLN
6.0000 mL | Freq: Once | INTRAVENOUS | Status: AC | PRN
  Administered 2023-02-27: 7.5 mL via INTRAVENOUS

## 2023-03-02 ENCOUNTER — Telehealth: Payer: Self-pay | Admitting: Urology

## 2023-03-02 NOTE — Telephone Encounter (Signed)
Patient was contacted to discuss his prostate MRI.  The prostate volume was 25.5 cc.  There was a PI-RADS 5 lesion in the left anterior PZ and a PI-RADS 3 lesion left posterior TZ in the mid gland.  I discussed PI-RADS 5 lesions are suspicious for high-grade prostate cancer and PI-RADS 3 lesions are considered indeterminate.  Recommend scheduling fusion biopsy for further evaluation and he desires to proceed.  The procedure was discussed.  We also discussed a standard template biopsy is performed in addition to ROI biopsies

## 2023-03-03 ENCOUNTER — Telehealth: Payer: Self-pay

## 2023-03-03 NOTE — Telephone Encounter (Signed)
Done.   Pls see previous telephone note.

## 2023-03-03 NOTE — Telephone Encounter (Signed)
Brent Kim 03/03/2023    Provider Requested: [facility] MRI date and location: 02/27/2023 [facility]   Date of fusion bx:04/14/23 Time: 1:00pm   Blood Thinners- None ASA- None Nsaids- Stop 2 days prior. Pt aware.  Clearance needed-N/a   Fleet's Enema OTC 2 hours prior - Pt aware.    Follow up appt: 04/29/23 at 945 with SCS  Instructions sent via my chart or mailed. CM

## 2023-04-14 ENCOUNTER — Ambulatory Visit (INDEPENDENT_AMBULATORY_CARE_PROVIDER_SITE_OTHER): Payer: Medicare Other | Admitting: Urology

## 2023-04-14 VITALS — BP 170/83 | HR 43

## 2023-04-14 DIAGNOSIS — Z2989 Encounter for other specified prophylactic measures: Secondary | ICD-10-CM

## 2023-04-14 DIAGNOSIS — C61 Malignant neoplasm of prostate: Secondary | ICD-10-CM | POA: Diagnosis not present

## 2023-04-14 DIAGNOSIS — R972 Elevated prostate specific antigen [PSA]: Secondary | ICD-10-CM

## 2023-04-14 MED ORDER — GENTAMICIN SULFATE 40 MG/ML IJ SOLN
80.0000 mg | Freq: Once | INTRAMUSCULAR | Status: AC
  Administered 2023-04-14: 80 mg via INTRAMUSCULAR

## 2023-04-14 MED ORDER — LEVOFLOXACIN 500 MG PO TABS
500.0000 mg | ORAL_TABLET | Freq: Once | ORAL | Status: AC
  Administered 2023-04-14: 500 mg via ORAL

## 2023-04-14 NOTE — Progress Notes (Signed)
   04/14/23  Indication: 82 year old male followed by Dr. Lonna Cobb found to have an elevated PSA of 5.7 and opted for prostate MRI.  This showed a 26 g prostate with a PI-RADS 5 lesion at the left anterior peripheral zone, as well as a PI-RADS 3 lesion at the left posterior transition zone  MRI Fusion Prostate Biopsy Procedure   Informed consent was obtained, and we discussed the risks of bleeding and infection/sepsis. A time out was performed to ensure correct patient identity.  Pre-Procedure: - Last PSA Level: 5.7 - Gentamicin and levaquin given for antibiotic prophylaxis -Prostate measured 26 g on MRI, PSA density 0.22 - No significant hypoechoic or median lobe noted  Procedure: - Prostate block performed using 10 cc 1% lidocaine  - MRI fusion biopsy was performed, and biopsies were taken from each of the ROI: ROI#1: PIRADS 5 lesion located left anterior peripheral zone(3 biopsies) ROI#2: PIRADS 3 lesion left posterior transition zone(2 biopsies)  - Standard biopsies taken from sextant areas, 12 under ultrasound guidance. - Total of 17 cores taken  Post-Procedure: - Patient tolerated the procedure well - He was counseled to seek immediate medical attention if experiences significant bleeding, fevers, or severe pain - Return in one week to discuss biopsy results  Assessment/ Plan: Will follow up in 1-2 weeks to discuss pathology with Dr. Mort Sawyers, MD 04/14/2023

## 2023-04-14 NOTE — Addendum Note (Signed)
Addended by: Frankey Shown on: 04/14/2023 01:40 PM   Modules accepted: Orders

## 2023-04-29 ENCOUNTER — Encounter: Payer: Self-pay | Admitting: Urology

## 2023-04-29 ENCOUNTER — Ambulatory Visit (INDEPENDENT_AMBULATORY_CARE_PROVIDER_SITE_OTHER): Payer: Medicare Other | Admitting: Urology

## 2023-04-29 VITALS — BP 120/80 | HR 70 | Ht 72.0 in | Wt 145.0 lb

## 2023-04-29 DIAGNOSIS — C61 Malignant neoplasm of prostate: Secondary | ICD-10-CM | POA: Diagnosis not present

## 2023-04-29 NOTE — Progress Notes (Signed)
Brent Kim,acting as a scribe for Riki Altes, MD.,have documented all relevant documentation on the behalf of Riki Altes, MD,as directed by  Riki Altes, MD while in the presence of Riki Altes, MD.  04/29/2023 9:56 AM   Brent Kim 30-Jul-1941 [id number]  Referring provider: Jerl Mina, MD [address] [facility] [place],  [place] [address]  Chief Complaint  Patient presents with   Results    HPI: Brent Kim is a 82 y.o. male who presents for follow up of prostate biopsy results.   Prostate MRI performed 01/2023 for a PSA of 5.7, which showed a 26 cc gland and a PI-RADS 5 lesion of the left anterior peripheral zone and a PI-RADS 3 lesion on the left posterior transition zone There was no evidence of extracapsular extension, seminal vesicle involvement, pelvic adenopathy, or bony metastatic disease in the pelvis MR fusion biopsy, 04/14/2023, with a 26 gram gland; 12 core template biopsies were performed, as well as 3 biopsies of the PI-RADS 5 lesion and 2 biopsies of the PI-RADS 3 lesion. 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 The PI-RADS 3 lesion was positive for Gleason 3+4 adenocarcinoma involving 25% of the submitted tissue. The left lateral mid-core showed Gleason 3+3 adenocarcinoma involving 18% and the left apical core, Gleason 4+4 adenocarcinoma involving 50%. The remainder of the template biopsies showed benign prostate tissue.    PMH: Past Medical History:  Diagnosis Date   Erectile dysfunction    GERD (gastroesophageal reflux disease)    Grover's disease    Hyperlipemia    Pneumonia    Urethral stricture     Surgical History: Past Surgical History:  Procedure Laterality Date   CATARACT EXTRACTION W/PHACO Left 09/26/2019   Procedure: CATARACT EXTRACTION PHACO AND INTRAOCULAR LENS PLACEMENT (IOC) LEFT  PANOPTIX TORIC;  Surgeon: Galen Manila, MD;  Location: [facility];  Service: Ophthalmology;  Laterality: Left;  0:46 16.3% 7.54   CATARACT EXTRACTION W/PHACO Right 11/12/2020   Procedure: CATARACT EXTRACTION PHACO AND INTRAOCULAR LENS PLACEMENT (IOC) RIGHT EYHANCE TORIC 6.52 00:43.9;  Surgeon: Galen Manila, MD;  Location: [facility];  Service: Ophthalmology;  Laterality: Right;   COLONOSCOPY WITH PROPOFOL N/A 07/15/2015   Procedure: COLONOSCOPY WITH PROPOFOL;  Surgeon: Scot Jun, MD;  Location: [facility];  Service: Endoscopy;  Laterality: N/A;   CYSTOSCOPY WITH DIRECT VISION INTERNAL URETHROTOMY     CYSTOURETHROSCOPY     DESTRUCTION LESION PENILE     VASECTOMY      Home Medications:  Allergies as of 04/29/2023       Reactions   Balsam Other (See Comments)   Other reaction(s): Other (See Comments) Positive patch test Positive patch test   Cetearyl Other (See Comments)   Other reaction(s): Other (See Comments) Positive patch test Positive patch test   Decyl Glucoside Other (See Comments)   Other reaction(s): Other (See Comments) Positive patch test Positive patch test   Linalool Other (See Comments)   Other reaction(s): Other (See Comments) Positive patch test Positive patch test        Medication List        Accurate as of Apr 29, 2023  9:56 AM. If you have any questions, ask your nurse or doctor.          cholecalciferol 1000 units tablet Commonly known as: VITAMIN D Take 1,000 Units by mouth daily.  loratadine 10 MG tablet Commonly known as: CLARITIN Take 10 mg by mouth daily.   multivitamin tablet Take 1 tablet by mouth daily.   omeprazole 20 MG capsule Commonly known as: PRILOSEC Take 20 mg by mouth daily.   sildenafil 100 MG tablet Commonly known as: VIAGRA Take 100 mg by mouth daily as needed for erectile dysfunction.   Testosterone 10 MG/ACT (2%) Gel Place 4 Pump onto the skin daily.   traZODone 50 MG  tablet Commonly known as: DESYREL Take 50 mg by mouth at bedtime.        Allergies:  Allergies  Allergen Reactions   Balsam Other (See Comments)    Other reaction(s): Other (See Comments) Positive patch test Positive patch test    Cetearyl Other (See Comments)    Other reaction(s): Other (See Comments) Positive patch test Positive patch test    Decyl Glucoside Other (See Comments)    Other reaction(s): Other (See Comments) Positive patch test Positive patch test    Linalool Other (See Comments)    Other reaction(s): Other (See Comments) Positive patch test Positive patch test     Family History: Family History  Problem Relation Age of Onset   Kidney cancer Mother    Lung cancer Mother    Stroke Father     Social History:  reports that he has never smoked. He has never used smokeless tobacco. He reports current alcohol use of about 14.0 standard drinks of alcohol per week. No history on file for drug use.   Physical Exam: BP 120/80   Pulse 70   Ht 6' (1.829 m)   Wt 145 lb (65.8 kg)   BMI 19.67 kg/m   Constitutional:  Alert and oriented, No acute distress. Respiratory: Normal respiratory effort, no increased work of breathing. Psychiatric: Normal mood and affect.   Assessment & Plan:    1. 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 Treatment efficacy of radiation and surgery is considered equivalent, with a slightly higher risk of recurrence with radiation after 10-15 years. We did discuss higher complication rate of radical  prostatectomy. He is interested in pursuing radiation and a referral to radiation oncology was placed. Bone scan ordered to complete distant staging.  I have reviewed the above documentation for accuracy and completeness, and I agree with the above.   Riki Altes, MD  [facility] [address] [place], [place] [address] [phone]

## 2023-05-10 ENCOUNTER — Encounter: Payer: Self-pay | Admitting: Radiation Oncology

## 2023-05-10 ENCOUNTER — Ambulatory Visit
Admission: RE | Admit: 2023-05-10 | Discharge: 2023-05-10 | Disposition: A | Discharge status: Home / Self Care | Payer: Medicare Other | Source: Ambulatory Visit | Attending: Radiation Oncology | Admitting: Radiation Oncology

## 2023-05-10 VITALS — BP 115/80 | HR 61 | Temp 97.6°F | Resp 16 | Ht 68.0 in | Wt 148.1 lb

## 2023-05-10 DIAGNOSIS — Z8051 Family history of malignant neoplasm of kidney: Secondary | ICD-10-CM | POA: Insufficient documentation

## 2023-05-10 DIAGNOSIS — K219 Gastro-esophageal reflux disease without esophagitis: Secondary | ICD-10-CM | POA: Diagnosis not present

## 2023-05-10 DIAGNOSIS — C61 Malignant neoplasm of prostate: Secondary | ICD-10-CM | POA: Diagnosis present

## 2023-05-10 DIAGNOSIS — Z79899 Other long term (current) drug therapy: Secondary | ICD-10-CM | POA: Insufficient documentation

## 2023-05-10 DIAGNOSIS — E785 Hyperlipidemia, unspecified: Secondary | ICD-10-CM | POA: Diagnosis not present

## 2023-05-10 DIAGNOSIS — L111 Transient acantholytic dermatosis [Grover]: Secondary | ICD-10-CM | POA: Diagnosis not present

## 2023-05-10 NOTE — Consult Note (Signed)
NEW PATIENT EVALUATION  Name: Brent Kim  MRN: [id number]  Date:   05/10/2023     DOB: May 13, 1941   This 82 y.o. 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  REFERRING PHYSICIAN: Jerl Mina, MD  CHIEF COMPLAINT:  Chief Complaint  Patient presents with   Prostate Cancer    DIAGNOSIS: The encounter diagnosis was Prostate cancer [facility]).   PREVIOUS INVESTIGATIONS:  MRI scan reviewed Pathology report reviewed Clinical notes reviewed  HPI: Patient is a 82 year old male in excellent general condition whose PSA slowly rose in early 24 up to 5.7.  MRI was performed showing a 26 cc gland with a PI-RADS 5 lesion of left anterior peripheral zone and a PI-RADS 3 lesion in the left posterior transition zone.  No evidence of extracapsular extension seminal vesicle involvement or pelvic adenopathy was noted.  He underwent biopsy showing 4 of 14 cores positive for adenocarcinoma the highest 1 core was Gleason 8 (4+4) and there was a mixture of Gleason 6 and 7.  Patient is fairly asymptomatic specifically denies nocturia any increased frequency or urgency abnormal bowel findings or bone pain.  He is now referred to radiation oncology for consideration of treatment.  PLANNED TREATMENT REGIMEN: Image guided IMRT radiation therapy plus Eligard  PAST MEDICAL HISTORY:  has a past medical history of Erectile dysfunction, GERD (gastroesophageal reflux disease), Grover's disease, Hyperlipemia, Pneumonia, and Urethral stricture.    PAST SURGICAL HISTORY:  Past Surgical History:  Procedure Laterality Date   CATARACT EXTRACTION W/PHACO Left 09/26/2019   Procedure: CATARACT EXTRACTION PHACO AND INTRAOCULAR LENS PLACEMENT (IOC) LEFT PANOPTIX TORIC;  Surgeon: Galen Manila, MD;  Location: [facility];  Service: Ophthalmology;  Laterality: Left;  0:46 16.3% 7.54    CATARACT EXTRACTION W/PHACO Right 11/12/2020   Procedure: CATARACT EXTRACTION PHACO AND INTRAOCULAR LENS PLACEMENT (IOC) RIGHT EYHANCE TORIC 6.52 00:43.9;  Surgeon: Galen Manila, MD;  Location: [facility];  Service: Ophthalmology;  Laterality: Right;   COLONOSCOPY WITH PROPOFOL N/A 07/15/2015   Procedure: COLONOSCOPY WITH PROPOFOL;  Surgeon: Scot Jun, MD;  Location: [facility] ENDOSCOPY;  Service: Endoscopy;  Laterality: N/A;   CYSTOSCOPY WITH DIRECT VISION INTERNAL URETHROTOMY     CYSTOURETHROSCOPY     DESTRUCTION LESION PENILE     VASECTOMY      FAMILY HISTORY: family history includes Kidney cancer in his mother; Lung cancer in his mother; Stroke in his father.  SOCIAL HISTORY:  reports that he has never smoked. He has never used smokeless tobacco. He reports current alcohol use of about 14.0 standard drinks of alcohol per week.  ALLERGIES: Balsam, Cetearyl, Decyl glucoside, and Linalool  MEDICATIONS:  Current Outpatient Medications  Medication Sig Dispense Refill   cholecalciferol (VITAMIN D) 1000 UNITS tablet Take 1,000 Units by mouth daily.     loratadine (CLARITIN) 10 MG tablet Take 10 mg by mouth daily.     Multiple Vitamin (MULTIVITAMIN) tablet Take 1 tablet by mouth daily.     omeprazole (PRILOSEC) 20 MG capsule Take 20 mg by mouth daily.     sildenafil (VIAGRA) 100 MG tablet Take 100 mg by mouth daily as needed for erectile dysfunction.      Testosterone 10 MG/ACT (2%) GEL Place 4 Pump onto the skin daily.      traZODone (DESYREL) 50 MG tablet Take 50 mg by mouth at bedtime.     No  current facility-administered medications for this encounter.    ECOG PERFORMANCE STATUS:  0 - Asymptomatic  REVIEW OF SYSTEMS: Patient denies any weight loss, fatigue, weakness, fever, chills or night sweats. Patient denies any loss of vision, blurred vision. Patient denies any ringing  of the ears or hearing loss. No irregular heartbeat. Patient denies heart murmur or history of  fainting. Patient denies any chest pain or pain radiating to her upper extremities. Patient denies any shortness of breath, difficulty breathing at night, cough or hemoptysis. Patient denies any swelling in the lower legs. Patient denies any nausea vomiting, vomiting of blood, or coffee ground material in the vomitus. Patient denies any stomach pain. Patient states has had normal bowel movements no significant constipation or diarrhea. Patient denies any dysuria, hematuria or significant nocturia. Patient denies any problems walking, swelling in the joints or loss of balance. Patient denies any skin changes, loss of hair or loss of weight. Patient denies any excessive worrying or anxiety or significant depression. Patient denies any problems with insomnia. Patient denies excessive thirst, polyuria, polydipsia. Patient denies any swollen glands, patient denies easy bruising or easy bleeding. Patient denies any recent infections, allergies or URI. Patient "s visual fields have not changed significantly in recent time.   PHYSICAL EXAM: BP 115/80   Pulse 61   Temp 97.6 F (36.4 C) (Tympanic)   Resp 16   Ht 5\' 8"  (1.727 m)   Wt 148 lb 1.6 oz (67.2 kg)   BMI 22.52 kg/m  Well-developed well-nourished patient in NAD. HEENT reveals PERLA, EOMI, discs not visualized.  Oral cavity is clear. No oral mucosal lesions are identified. Neck is clear without evidence of cervical or supraclavicular adenopathy. Lungs are clear to A&P. Cardiac examination is essentially unremarkable with regular rate and rhythm without murmur rub or thrill. Abdomen is benign with no organomegaly or masses noted. Motor sensory and DTR levels are equal and symmetric in the upper and lower extremities. Cranial nerves II through XII are grossly intact. Proprioception is intact. No peripheral adenopathy or edema is identified. No motor or sensory levels are noted. Crude visual fields are within normal range.  LABORATORY DATA: Pathology reports  reviewed    RADIOLOGY RESULTS: MRI scan reviewed compatible with above-stated findings   IMPRESSION: Stage IIc Gleason 8 adenocarcinoma the prostate presenting with a PSA in the 6 range in an 82 year old male  PLAN: This time I have recommended IMRT image guided radiation therapy to 80 Gray over 8 weeks.  Risks and benefits of treatment including increased lower urinary tract symptoms diarrhea fatigue alteration of blood counts all were reviewed in detail with the patient.  I have asked Dr. Lonna Cobb to place fiducial markers for daily image guided treatment.  I also asked him to start the patient on 20-month depot of Eligard.  Patient comprehends her treatment plan well.  Will set him up for CT simulation after he returns from vacation in early July.  I would like to take this opportunity to thank you for allowing me to participate in the care of your patient.Carmina Miller, MD

## 2023-05-11 ENCOUNTER — Telehealth: Payer: Self-pay

## 2023-05-11 NOTE — Telephone Encounter (Signed)
-----   Message from Dorcas Carrow, RN sent at 05/10/2023  2:08 PM EDT ----- Regarding: Eligard and Markers placed Good Afternoon,  This patient will need to have Markers placed and an Eligard injection.  He and his wife will be out of town until 40-16 and will be leaving again on 6-26.  H would like for your appointment to be between these dates.   He has the markers with him.   Ricki Rodriguez

## 2023-05-11 NOTE — Telephone Encounter (Signed)
No PA required for Eligard.  

## 2023-05-12 ENCOUNTER — Institutional Professional Consult (permissible substitution): Payer: Medicare Other | Admitting: Radiation Oncology

## 2023-05-18 ENCOUNTER — Telehealth: Payer: Self-pay | Admitting: Radiation Oncology

## 2023-05-18 NOTE — Telephone Encounter (Signed)
Pt left a voicemail stating that he will be out of the country on 7/9 and wants to reschedule his appt (CT SIMULATION)

## 2023-05-24 ENCOUNTER — Ambulatory Visit (INDEPENDENT_AMBULATORY_CARE_PROVIDER_SITE_OTHER): Payer: Medicare Other | Admitting: Urology

## 2023-05-24 ENCOUNTER — Encounter: Payer: Medicare Other | Admitting: Urology

## 2023-05-24 ENCOUNTER — Encounter: Payer: Self-pay | Admitting: Urology

## 2023-05-24 VITALS — BP 126/75 | HR 60 | Ht 68.0 in | Wt 140.0 lb

## 2023-05-24 DIAGNOSIS — Z2989 Encounter for other specified prophylactic measures: Secondary | ICD-10-CM

## 2023-05-24 DIAGNOSIS — C61 Malignant neoplasm of prostate: Secondary | ICD-10-CM | POA: Diagnosis not present

## 2023-05-24 MED ORDER — LEVOFLOXACIN 500 MG PO TABS
500.0000 mg | ORAL_TABLET | Freq: Once | ORAL | Status: AC
Start: 2023-05-24 — End: 2023-05-24
  Administered 2023-05-24: 500 mg via ORAL

## 2023-05-24 MED ORDER — LEUPROLIDE ACETATE (6 MONTH) 45 MG ~~LOC~~ KIT
45.0000 mg | PACK | Freq: Once | SUBCUTANEOUS | Status: DC
Start: 2023-05-24 — End: 2023-05-24

## 2023-05-24 MED ORDER — GENTAMICIN SULFATE 40 MG/ML IJ SOLN
80.0000 mg | Freq: Once | INTRAMUSCULAR | Status: AC
Start: 2023-05-24 — End: 2023-05-24
  Administered 2023-05-24: 80 mg via INTRAMUSCULAR

## 2023-05-24 NOTE — Progress Notes (Deleted)
Eligard SubQ Injection   Due to Prostate Cancer patient is present today for a Eligard Injection.  Medication: Eligard 6 month Dose: 45 mg  Location: right  Lot: [id number] Exp: 09/25/225  Patient tolerated well, no complications were noted  Performed by: ***  Per Dr. Lonna Cobb patient is to continue therapy once  This appointment was scheduled using wheel and given to patient today along with reminder continue on Vitamin D 800-1000iu and Calcium 1000-1200mg  daily while on Androgen Deprivation Therapy.  PA approval dates:

## 2023-05-25 ENCOUNTER — Encounter: Payer: Self-pay | Admitting: Urology

## 2023-05-25 NOTE — Progress Notes (Signed)
   05/25/23  CC: gold fiducial marker placement  HPI: 82 y.o. male with high risk prostate cancer who presents today for placement of fiducial seed markers in anticipation of his upcoming IMRT with Dr. Rushie Chestnut.  Prostate Gold fiducial Marker Placement Procedure   Informed consent was obtained after discussing risks/benefits of the procedure.  A time out was performed to ensure correct patient identity.  Pre-Procedure: - Gentamicin given prophylactically - PO Levaquin 500 mg also given today  Procedure: - Lidocaine jelly was administered per rectum - Rectal ultrasound probe was placed without difficulty and the prostate visualized - Prostatic block performed with 10 mL 1% Xylocaine - 3 fiducial gold seed markers placed, one at right base, one at left base, one at apex of prostate gland under transrectal ultrasound guidance  Post-Procedure: - Patient tolerated the procedure well - He was counseled to seek immediate medical attention if experiences any severe pain, significant bleeding, or fevers -Dr. Aggie Cosier had recommended ADT.  Patient is getting ready to leave for a 10-day [place] cruise and he will follow-up after his return for leuprolide injection    Irineo Axon, MD

## 2023-06-08 ENCOUNTER — Ambulatory Visit: Payer: Medicare Other

## 2023-06-10 ENCOUNTER — Ambulatory Visit
Admission: RE | Admit: 2023-06-10 | Discharge: 2023-06-10 | Disposition: A | Discharge status: Home / Self Care | Payer: Medicare Other | Source: Ambulatory Visit | Attending: Radiation Oncology | Admitting: Radiation Oncology

## 2023-06-10 DIAGNOSIS — C61 Malignant neoplasm of prostate: Secondary | ICD-10-CM | POA: Insufficient documentation

## 2023-06-10 DIAGNOSIS — L111 Transient acantholytic dermatosis [Grover]: Secondary | ICD-10-CM | POA: Insufficient documentation

## 2023-06-10 DIAGNOSIS — Z79899 Other long term (current) drug therapy: Secondary | ICD-10-CM | POA: Insufficient documentation

## 2023-06-10 DIAGNOSIS — K219 Gastro-esophageal reflux disease without esophagitis: Secondary | ICD-10-CM | POA: Insufficient documentation

## 2023-06-10 DIAGNOSIS — Z8051 Family history of malignant neoplasm of kidney: Secondary | ICD-10-CM | POA: Diagnosis not present

## 2023-06-10 DIAGNOSIS — E785 Hyperlipidemia, unspecified: Secondary | ICD-10-CM | POA: Insufficient documentation

## 2023-06-11 ENCOUNTER — Encounter: Payer: Self-pay | Admitting: Physician Assistant

## 2023-06-11 ENCOUNTER — Ambulatory Visit (INDEPENDENT_AMBULATORY_CARE_PROVIDER_SITE_OTHER): Payer: Medicare Other | Admitting: Physician Assistant

## 2023-06-11 VITALS — BP 122/77 | HR 51

## 2023-06-11 DIAGNOSIS — C61 Malignant neoplasm of prostate: Secondary | ICD-10-CM

## 2023-06-11 MED ORDER — LEUPROLIDE ACETATE (6 MONTH) 45 MG ~~LOC~~ KIT
45.0000 mg | PACK | Freq: Once | SUBCUTANEOUS | Status: AC
Start: 2023-06-11 — End: 2023-06-11
  Administered 2023-06-11: 45 mg via SUBCUTANEOUS

## 2023-06-11 NOTE — Patient Instructions (Signed)
Please take the following dietary supplements for the duration of your hormone suppression therapy to reduce your risk for bone loss: -Calcium 1000-1200mg daily -Vitamin D 800-1000IU daily  

## 2023-06-11 NOTE — Progress Notes (Signed)
Patient presented to clinic today for initiation of 6 months of ADT with Eligard per Drs. Chrystal and Stoioff.   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  We discussed the anticipated side effects of ADT today including hot flashes, decreased libido, erectile dysfunction, breast tenderness or size changes, fatigue, weight gain, bone loss, muscle mass loss, brain fog, increased cholesterol, increased blood pressure, increased blood sugar, and increased risk for stroke and heart attack.  We discussed that we may be able to resume TRT in 2-3 years depending on his clinical course.  I counseled him to maintain a healthy diet and continue regular exercise including weightbearing exercise to mitigate bone and muscle loss for the duration of therapy.  Additionally, I counseled him to start daily calcium 1000-1200mg  and vitamin D 800-1000IU supplements to reduce bone loss.  Lastly, I encouraged him to maintain regular follow-ups with his PCP. He expressed understanding, all questions answered. Printed resources on ADT provided today.  Carman Ching, PA-C 06/11/23 3:08 PM  I spent 12 minutes on the day of the encounter to include pre-visit record review, face-to-face time with the patient, and post-visit ordering of tests.

## 2023-06-11 NOTE — Progress Notes (Addendum)
Eligard SubQ Injection   Due to Prostate Cancer patient is present today for a Eligard Injection.  Medication: Eligard 6 month Dose: 45 mg  Location: right upper outer quadrant Lot: [id number] Exp: 08/2024  Patient tolerated well, no complications were noted  Performed by: Talbert Nan

## 2023-06-14 DIAGNOSIS — C61 Malignant neoplasm of prostate: Secondary | ICD-10-CM | POA: Diagnosis not present

## 2023-06-18 ENCOUNTER — Other Ambulatory Visit: Payer: Self-pay | Admitting: *Deleted

## 2023-06-18 DIAGNOSIS — C61 Malignant neoplasm of prostate: Secondary | ICD-10-CM

## 2023-06-21 ENCOUNTER — Ambulatory Visit
Admission: RE | Admit: 2023-06-21 | Discharge: 2023-06-21 | Disposition: A | Discharge status: Home / Self Care | Payer: Medicare Other | Source: Ambulatory Visit | Attending: Radiation Oncology | Admitting: Radiation Oncology

## 2023-06-22 ENCOUNTER — Ambulatory Visit
Admission: RE | Admit: 2023-06-22 | Discharge: 2023-06-22 | Disposition: A | Discharge status: Home / Self Care | Payer: Medicare Other | Source: Ambulatory Visit | Attending: Radiation Oncology | Admitting: Radiation Oncology

## 2023-06-22 ENCOUNTER — Other Ambulatory Visit: Payer: Self-pay

## 2023-06-22 DIAGNOSIS — C61 Malignant neoplasm of prostate: Secondary | ICD-10-CM | POA: Diagnosis not present

## 2023-06-22 LAB — RAD ONC ARIA SESSION SUMMARY
Course Elapsed Days: 0
Plan Fractions Treated to Date: 1
Plan Prescribed Dose Per Fraction: 2 Gy
Plan Total Fractions Prescribed: 40
Plan Total Prescribed Dose: 80 Gy
Reference Point Dosage Given to Date: 2 Gy
Reference Point Session Dosage Given: 2 Gy
Session Number: 1

## 2023-06-23 ENCOUNTER — Ambulatory Visit
Admission: RE | Admit: 2023-06-23 | Discharge: 2023-06-23 | Disposition: A | Discharge status: Home / Self Care | Payer: Medicare Other | Source: Ambulatory Visit | Attending: Radiation Oncology | Admitting: Radiation Oncology

## 2023-06-23 ENCOUNTER — Other Ambulatory Visit: Payer: Self-pay

## 2023-06-23 DIAGNOSIS — C61 Malignant neoplasm of prostate: Secondary | ICD-10-CM | POA: Diagnosis not present

## 2023-06-23 LAB — RAD ONC ARIA SESSION SUMMARY
Course Elapsed Days: 1
Plan Fractions Treated to Date: 2
Plan Prescribed Dose Per Fraction: 2 Gy
Plan Total Fractions Prescribed: 40
Plan Total Prescribed Dose: 80 Gy
Reference Point Dosage Given to Date: 4 Gy
Reference Point Session Dosage Given: 2 Gy
Session Number: 2

## 2023-06-24 ENCOUNTER — Other Ambulatory Visit: Payer: Self-pay

## 2023-06-24 ENCOUNTER — Ambulatory Visit
Admission: RE | Admit: 2023-06-24 | Discharge: 2023-06-24 | Disposition: A | Discharge status: Home / Self Care | Payer: Medicare Other | Source: Ambulatory Visit | Attending: Radiation Oncology | Admitting: Radiation Oncology

## 2023-06-24 DIAGNOSIS — C61 Malignant neoplasm of prostate: Secondary | ICD-10-CM | POA: Diagnosis not present

## 2023-06-24 LAB — RAD ONC ARIA SESSION SUMMARY
Course Elapsed Days: 2
Plan Fractions Treated to Date: 3
Plan Prescribed Dose Per Fraction: 2 Gy
Plan Total Fractions Prescribed: 40
Plan Total Prescribed Dose: 80 Gy
Reference Point Dosage Given to Date: 6 Gy
Reference Point Session Dosage Given: 2 Gy
Session Number: 3

## 2023-06-25 ENCOUNTER — Other Ambulatory Visit: Payer: Self-pay

## 2023-06-25 ENCOUNTER — Ambulatory Visit
Admission: RE | Admit: 2023-06-25 | Discharge: 2023-06-25 | Disposition: A | Discharge status: Home / Self Care | Payer: Medicare Other | Source: Ambulatory Visit | Attending: Radiation Oncology | Admitting: Radiation Oncology

## 2023-06-25 DIAGNOSIS — C61 Malignant neoplasm of prostate: Secondary | ICD-10-CM | POA: Diagnosis not present

## 2023-06-25 LAB — RAD ONC ARIA SESSION SUMMARY
Course Elapsed Days: 3
Plan Fractions Treated to Date: 4
Plan Prescribed Dose Per Fraction: 2 Gy
Plan Total Fractions Prescribed: 40
Plan Total Prescribed Dose: 80 Gy
Reference Point Dosage Given to Date: 8 Gy
Reference Point Session Dosage Given: 2 Gy
Session Number: 4

## 2023-06-28 ENCOUNTER — Inpatient Hospital Stay: Payer: Medicare Other

## 2023-06-28 ENCOUNTER — Ambulatory Visit
Admission: RE | Admit: 2023-06-28 | Discharge: 2023-06-28 | Disposition: A | Discharge status: Home / Self Care | Payer: Medicare Other | Source: Ambulatory Visit | Attending: Radiation Oncology | Admitting: Radiation Oncology

## 2023-06-28 ENCOUNTER — Other Ambulatory Visit: Payer: Self-pay

## 2023-06-28 DIAGNOSIS — C61 Malignant neoplasm of prostate: Secondary | ICD-10-CM | POA: Insufficient documentation

## 2023-06-28 LAB — RAD ONC ARIA SESSION SUMMARY
Course Elapsed Days: 6
Plan Fractions Treated to Date: 5
Plan Prescribed Dose Per Fraction: 2 Gy
Plan Total Fractions Prescribed: 40
Plan Total Prescribed Dose: 80 Gy
Reference Point Dosage Given to Date: 10 Gy
Reference Point Session Dosage Given: 2 Gy
Session Number: 5

## 2023-06-28 LAB — CBC (CANCER CENTER ONLY)
HCT: 39.7 % (ref 39.0–52.0)
Hemoglobin: 13.3 g/dL (ref 13.0–17.0)
MCH: 34.3 pg — ABNORMAL HIGH (ref 26.0–34.0)
MCHC: 33.5 g/dL (ref 30.0–36.0)
MCV: 102.3 fL — ABNORMAL HIGH (ref 80.0–100.0)
Platelet Count: 206 10*3/uL (ref 150–400)
RBC: 3.88 MIL/uL — ABNORMAL LOW (ref 4.22–5.81)
RDW: 12.8 % (ref 11.5–15.5)
WBC Count: 4.5 10*3/uL (ref 4.0–10.5)
nRBC: 0 % (ref 0.0–0.2)

## 2023-06-29 ENCOUNTER — Ambulatory Visit
Admission: RE | Admit: 2023-06-29 | Discharge: 2023-06-29 | Disposition: A | Discharge status: Home / Self Care | Payer: Medicare Other | Source: Ambulatory Visit | Attending: Radiation Oncology | Admitting: Radiation Oncology

## 2023-06-29 ENCOUNTER — Other Ambulatory Visit: Payer: Self-pay

## 2023-06-29 DIAGNOSIS — C61 Malignant neoplasm of prostate: Secondary | ICD-10-CM | POA: Diagnosis not present

## 2023-06-29 LAB — RAD ONC ARIA SESSION SUMMARY
Course Elapsed Days: 7
Plan Fractions Treated to Date: 6
Plan Prescribed Dose Per Fraction: 2 Gy
Plan Total Fractions Prescribed: 40
Plan Total Prescribed Dose: 80 Gy
Reference Point Dosage Given to Date: 12 Gy
Reference Point Session Dosage Given: 2 Gy
Session Number: 6

## 2023-06-30 ENCOUNTER — Ambulatory Visit
Admission: RE | Admit: 2023-06-30 | Discharge: 2023-06-30 | Disposition: A | Discharge status: Home / Self Care | Payer: Medicare Other | Source: Ambulatory Visit | Attending: Radiation Oncology | Admitting: Radiation Oncology

## 2023-06-30 ENCOUNTER — Other Ambulatory Visit: Payer: Self-pay

## 2023-06-30 DIAGNOSIS — C61 Malignant neoplasm of prostate: Secondary | ICD-10-CM | POA: Diagnosis not present

## 2023-06-30 LAB — RAD ONC ARIA SESSION SUMMARY
Course Elapsed Days: 8
Plan Fractions Treated to Date: 7
Plan Prescribed Dose Per Fraction: 2 Gy
Plan Total Fractions Prescribed: 40
Plan Total Prescribed Dose: 80 Gy
Reference Point Dosage Given to Date: 14 Gy
Reference Point Session Dosage Given: 2 Gy
Session Number: 7

## 2023-07-01 ENCOUNTER — Ambulatory Visit
Admission: RE | Admit: 2023-07-01 | Discharge: 2023-07-01 | Disposition: A | Discharge status: Home / Self Care | Payer: Medicare Other | Source: Ambulatory Visit | Attending: Radiation Oncology | Admitting: Radiation Oncology

## 2023-07-01 ENCOUNTER — Other Ambulatory Visit: Payer: Self-pay

## 2023-07-01 DIAGNOSIS — L111 Transient acantholytic dermatosis [Grover]: Secondary | ICD-10-CM | POA: Insufficient documentation

## 2023-07-01 DIAGNOSIS — Z8051 Family history of malignant neoplasm of kidney: Secondary | ICD-10-CM | POA: Insufficient documentation

## 2023-07-01 DIAGNOSIS — C61 Malignant neoplasm of prostate: Secondary | ICD-10-CM | POA: Diagnosis present

## 2023-07-01 DIAGNOSIS — K219 Gastro-esophageal reflux disease without esophagitis: Secondary | ICD-10-CM | POA: Diagnosis not present

## 2023-07-01 DIAGNOSIS — E785 Hyperlipidemia, unspecified: Secondary | ICD-10-CM | POA: Diagnosis not present

## 2023-07-01 DIAGNOSIS — Z79899 Other long term (current) drug therapy: Secondary | ICD-10-CM | POA: Insufficient documentation

## 2023-07-01 LAB — RAD ONC ARIA SESSION SUMMARY
Course Elapsed Days: 9
Plan Fractions Treated to Date: 8
Plan Prescribed Dose Per Fraction: 2 Gy
Plan Total Fractions Prescribed: 40
Plan Total Prescribed Dose: 80 Gy
Reference Point Dosage Given to Date: 16 Gy
Reference Point Session Dosage Given: 2 Gy
Session Number: 8

## 2023-07-02 ENCOUNTER — Ambulatory Visit
Admission: RE | Admit: 2023-07-02 | Discharge: 2023-07-02 | Disposition: A | Discharge status: Home / Self Care | Payer: Medicare Other | Source: Ambulatory Visit | Attending: Radiation Oncology | Admitting: Radiation Oncology

## 2023-07-02 ENCOUNTER — Other Ambulatory Visit: Payer: Self-pay

## 2023-07-02 DIAGNOSIS — C61 Malignant neoplasm of prostate: Secondary | ICD-10-CM | POA: Diagnosis not present

## 2023-07-02 LAB — RAD ONC ARIA SESSION SUMMARY
Course Elapsed Days: 10
Plan Fractions Treated to Date: 9
Plan Prescribed Dose Per Fraction: 2 Gy
Plan Total Fractions Prescribed: 40
Plan Total Prescribed Dose: 80 Gy
Reference Point Dosage Given to Date: 18 Gy
Reference Point Session Dosage Given: 2 Gy
Session Number: 9

## 2023-07-05 ENCOUNTER — Other Ambulatory Visit: Payer: Self-pay

## 2023-07-05 ENCOUNTER — Ambulatory Visit
Admission: RE | Admit: 2023-07-05 | Discharge: 2023-07-05 | Disposition: A | Discharge status: Home / Self Care | Payer: Medicare Other | Source: Ambulatory Visit | Attending: Radiation Oncology | Admitting: Radiation Oncology

## 2023-07-05 DIAGNOSIS — C61 Malignant neoplasm of prostate: Secondary | ICD-10-CM | POA: Diagnosis not present

## 2023-07-05 LAB — RAD ONC ARIA SESSION SUMMARY
Course Elapsed Days: 13
Plan Fractions Treated to Date: 10
Plan Prescribed Dose Per Fraction: 2 Gy
Plan Total Fractions Prescribed: 40
Plan Total Prescribed Dose: 80 Gy
Reference Point Dosage Given to Date: 20 Gy
Reference Point Session Dosage Given: 2 Gy
Session Number: 10

## 2023-07-06 ENCOUNTER — Other Ambulatory Visit: Payer: Self-pay

## 2023-07-06 ENCOUNTER — Ambulatory Visit: Admission: RE | Admit: 2023-07-06 | Payer: Medicare Other | Source: Ambulatory Visit

## 2023-07-06 DIAGNOSIS — C61 Malignant neoplasm of prostate: Secondary | ICD-10-CM | POA: Diagnosis not present

## 2023-07-06 LAB — RAD ONC ARIA SESSION SUMMARY
Course Elapsed Days: 14
Plan Fractions Treated to Date: 11
Plan Prescribed Dose Per Fraction: 2 Gy
Plan Total Fractions Prescribed: 40
Plan Total Prescribed Dose: 80 Gy
Reference Point Dosage Given to Date: 22 Gy
Reference Point Session Dosage Given: 2 Gy
Session Number: 11

## 2023-07-07 ENCOUNTER — Other Ambulatory Visit: Payer: Self-pay

## 2023-07-07 ENCOUNTER — Ambulatory Visit
Admission: RE | Admit: 2023-07-07 | Discharge: 2023-07-07 | Disposition: A | Discharge status: Home / Self Care | Payer: Medicare Other | Source: Ambulatory Visit | Attending: Radiation Oncology | Admitting: Radiation Oncology

## 2023-07-07 DIAGNOSIS — C61 Malignant neoplasm of prostate: Secondary | ICD-10-CM | POA: Diagnosis not present

## 2023-07-07 LAB — RAD ONC ARIA SESSION SUMMARY
Course Elapsed Days: 15
Plan Fractions Treated to Date: 12
Plan Prescribed Dose Per Fraction: 2 Gy
Plan Total Fractions Prescribed: 40
Plan Total Prescribed Dose: 80 Gy
Reference Point Dosage Given to Date: 24 Gy
Reference Point Session Dosage Given: 2 Gy
Session Number: 12

## 2023-07-08 ENCOUNTER — Ambulatory Visit
Admission: RE | Admit: 2023-07-08 | Discharge: 2023-07-08 | Disposition: A | Discharge status: Home / Self Care | Payer: Medicare Other | Source: Ambulatory Visit | Attending: Radiation Oncology | Admitting: Radiation Oncology

## 2023-07-08 ENCOUNTER — Other Ambulatory Visit: Payer: Self-pay

## 2023-07-08 DIAGNOSIS — C61 Malignant neoplasm of prostate: Secondary | ICD-10-CM | POA: Diagnosis not present

## 2023-07-08 LAB — RAD ONC ARIA SESSION SUMMARY
Course Elapsed Days: 16
Plan Fractions Treated to Date: 13
Plan Prescribed Dose Per Fraction: 2 Gy
Plan Total Fractions Prescribed: 40
Plan Total Prescribed Dose: 80 Gy
Reference Point Dosage Given to Date: 26 Gy
Reference Point Session Dosage Given: 2 Gy
Session Number: 13

## 2023-07-09 ENCOUNTER — Other Ambulatory Visit: Payer: Self-pay

## 2023-07-09 ENCOUNTER — Ambulatory Visit
Admission: RE | Admit: 2023-07-09 | Discharge: 2023-07-09 | Disposition: A | Discharge status: Home / Self Care | Payer: Medicare Other | Source: Ambulatory Visit | Attending: Radiation Oncology | Admitting: Radiation Oncology

## 2023-07-09 DIAGNOSIS — C61 Malignant neoplasm of prostate: Secondary | ICD-10-CM | POA: Diagnosis not present

## 2023-07-09 LAB — RAD ONC ARIA SESSION SUMMARY
Course Elapsed Days: 17
Plan Fractions Treated to Date: 14
Plan Prescribed Dose Per Fraction: 2 Gy
Plan Total Fractions Prescribed: 40
Plan Total Prescribed Dose: 80 Gy
Reference Point Dosage Given to Date: 28 Gy
Reference Point Session Dosage Given: 2 Gy
Session Number: 14

## 2023-07-12 ENCOUNTER — Inpatient Hospital Stay: Payer: Medicare Other

## 2023-07-12 ENCOUNTER — Ambulatory Visit
Admission: RE | Admit: 2023-07-12 | Discharge: 2023-07-12 | Disposition: A | Discharge status: Home / Self Care | Payer: Medicare Other | Source: Ambulatory Visit | Attending: Radiation Oncology | Admitting: Radiation Oncology

## 2023-07-12 ENCOUNTER — Other Ambulatory Visit: Payer: Self-pay

## 2023-07-12 DIAGNOSIS — C61 Malignant neoplasm of prostate: Secondary | ICD-10-CM | POA: Insufficient documentation

## 2023-07-12 LAB — CBC (CANCER CENTER ONLY)
HCT: 37.3 % — ABNORMAL LOW (ref 39.0–52.0)
Hemoglobin: 12.7 g/dL — ABNORMAL LOW (ref 13.0–17.0)
MCH: 34.5 pg — ABNORMAL HIGH (ref 26.0–34.0)
MCHC: 34 g/dL (ref 30.0–36.0)
MCV: 101.4 fL — ABNORMAL HIGH (ref 80.0–100.0)
Platelet Count: 253 10*3/uL (ref 150–400)
RBC: 3.68 MIL/uL — ABNORMAL LOW (ref 4.22–5.81)
RDW: 12.5 % (ref 11.5–15.5)
WBC Count: 4.4 10*3/uL (ref 4.0–10.5)
nRBC: 0 % (ref 0.0–0.2)

## 2023-07-12 LAB — RAD ONC ARIA SESSION SUMMARY
Course Elapsed Days: 20
Plan Fractions Treated to Date: 15
Plan Prescribed Dose Per Fraction: 2 Gy
Plan Total Fractions Prescribed: 40
Plan Total Prescribed Dose: 80 Gy
Reference Point Dosage Given to Date: 30 Gy
Reference Point Session Dosage Given: 2 Gy
Session Number: 15

## 2023-07-13 ENCOUNTER — Ambulatory Visit: Admission: RE | Admit: 2023-07-13 | Payer: Medicare Other | Source: Ambulatory Visit

## 2023-07-13 ENCOUNTER — Other Ambulatory Visit: Payer: Self-pay

## 2023-07-13 DIAGNOSIS — C61 Malignant neoplasm of prostate: Secondary | ICD-10-CM | POA: Diagnosis not present

## 2023-07-13 LAB — RAD ONC ARIA SESSION SUMMARY
Course Elapsed Days: 21
Plan Fractions Treated to Date: 16
Plan Prescribed Dose Per Fraction: 2 Gy
Plan Total Fractions Prescribed: 40
Plan Total Prescribed Dose: 80 Gy
Reference Point Dosage Given to Date: 32 Gy
Reference Point Session Dosage Given: 2 Gy
Session Number: 16

## 2023-07-14 ENCOUNTER — Other Ambulatory Visit: Payer: Self-pay

## 2023-07-14 ENCOUNTER — Ambulatory Visit
Admission: RE | Admit: 2023-07-14 | Discharge: 2023-07-14 | Disposition: A | Discharge status: Home / Self Care | Payer: Medicare Other | Source: Ambulatory Visit | Attending: Radiation Oncology | Admitting: Radiation Oncology

## 2023-07-14 DIAGNOSIS — C61 Malignant neoplasm of prostate: Secondary | ICD-10-CM | POA: Diagnosis not present

## 2023-07-14 LAB — RAD ONC ARIA SESSION SUMMARY
Course Elapsed Days: 22
Plan Fractions Treated to Date: 17
Plan Prescribed Dose Per Fraction: 2 Gy
Plan Total Fractions Prescribed: 40
Plan Total Prescribed Dose: 80 Gy
Reference Point Dosage Given to Date: 34 Gy
Reference Point Session Dosage Given: 2 Gy
Session Number: 17

## 2023-07-15 ENCOUNTER — Ambulatory Visit
Admission: RE | Admit: 2023-07-15 | Discharge: 2023-07-15 | Disposition: A | Discharge status: Home / Self Care | Payer: Medicare Other | Source: Ambulatory Visit | Attending: Radiation Oncology | Admitting: Radiation Oncology

## 2023-07-15 ENCOUNTER — Other Ambulatory Visit: Payer: Self-pay

## 2023-07-15 DIAGNOSIS — C61 Malignant neoplasm of prostate: Secondary | ICD-10-CM | POA: Diagnosis not present

## 2023-07-15 LAB — RAD ONC ARIA SESSION SUMMARY
Course Elapsed Days: 23
Plan Fractions Treated to Date: 18
Plan Prescribed Dose Per Fraction: 2 Gy
Plan Total Fractions Prescribed: 40
Plan Total Prescribed Dose: 80 Gy
Reference Point Dosage Given to Date: 36 Gy
Reference Point Session Dosage Given: 2 Gy
Session Number: 18

## 2023-07-16 ENCOUNTER — Other Ambulatory Visit: Payer: Self-pay

## 2023-07-16 ENCOUNTER — Ambulatory Visit
Admission: RE | Admit: 2023-07-16 | Discharge: 2023-07-16 | Disposition: A | Discharge status: Home / Self Care | Payer: Medicare Other | Source: Ambulatory Visit | Attending: Radiation Oncology | Admitting: Radiation Oncology

## 2023-07-16 DIAGNOSIS — C61 Malignant neoplasm of prostate: Secondary | ICD-10-CM | POA: Diagnosis not present

## 2023-07-16 LAB — RAD ONC ARIA SESSION SUMMARY
Course Elapsed Days: 24
Plan Fractions Treated to Date: 19
Plan Prescribed Dose Per Fraction: 2 Gy
Plan Total Fractions Prescribed: 40
Plan Total Prescribed Dose: 80 Gy
Reference Point Dosage Given to Date: 38 Gy
Reference Point Session Dosage Given: 2 Gy
Session Number: 19

## 2023-07-19 ENCOUNTER — Ambulatory Visit
Admission: RE | Admit: 2023-07-19 | Discharge: 2023-07-19 | Disposition: A | Discharge status: Home / Self Care | Payer: Medicare Other | Source: Ambulatory Visit | Attending: Radiation Oncology | Admitting: Radiation Oncology

## 2023-07-19 ENCOUNTER — Other Ambulatory Visit: Payer: Self-pay

## 2023-07-19 DIAGNOSIS — C61 Malignant neoplasm of prostate: Secondary | ICD-10-CM | POA: Diagnosis not present

## 2023-07-19 LAB — RAD ONC ARIA SESSION SUMMARY
Course Elapsed Days: 27
Plan Fractions Treated to Date: 20
Plan Prescribed Dose Per Fraction: 2 Gy
Plan Total Fractions Prescribed: 40
Plan Total Prescribed Dose: 80 Gy
Reference Point Dosage Given to Date: 40 Gy
Reference Point Session Dosage Given: 2 Gy
Session Number: 20

## 2023-07-20 ENCOUNTER — Other Ambulatory Visit: Payer: Self-pay

## 2023-07-20 ENCOUNTER — Ambulatory Visit
Admission: RE | Admit: 2023-07-20 | Discharge: 2023-07-20 | Disposition: A | Discharge status: Home / Self Care | Payer: Medicare Other | Source: Ambulatory Visit | Attending: Radiation Oncology | Admitting: Radiation Oncology

## 2023-07-20 DIAGNOSIS — C61 Malignant neoplasm of prostate: Secondary | ICD-10-CM | POA: Diagnosis not present

## 2023-07-20 LAB — RAD ONC ARIA SESSION SUMMARY
Course Elapsed Days: 28
Plan Fractions Treated to Date: 21
Plan Prescribed Dose Per Fraction: 2 Gy
Plan Total Fractions Prescribed: 40
Plan Total Prescribed Dose: 80 Gy
Reference Point Dosage Given to Date: 42 Gy
Reference Point Session Dosage Given: 2 Gy
Session Number: 21

## 2023-07-21 ENCOUNTER — Other Ambulatory Visit: Payer: Self-pay

## 2023-07-21 ENCOUNTER — Ambulatory Visit: Admission: RE | Admit: 2023-07-21 | Payer: Medicare Other | Source: Ambulatory Visit

## 2023-07-21 DIAGNOSIS — C61 Malignant neoplasm of prostate: Secondary | ICD-10-CM | POA: Diagnosis not present

## 2023-07-21 LAB — RAD ONC ARIA SESSION SUMMARY
Course Elapsed Days: 29
Plan Fractions Treated to Date: 22
Plan Prescribed Dose Per Fraction: 2 Gy
Plan Total Fractions Prescribed: 40
Plan Total Prescribed Dose: 80 Gy
Reference Point Dosage Given to Date: 44 Gy
Reference Point Session Dosage Given: 2 Gy
Session Number: 22

## 2023-07-22 ENCOUNTER — Ambulatory Visit
Admission: RE | Admit: 2023-07-22 | Discharge: 2023-07-22 | Disposition: A | Discharge status: Home / Self Care | Payer: Medicare Other | Source: Ambulatory Visit | Attending: Radiation Oncology | Admitting: Radiation Oncology

## 2023-07-22 ENCOUNTER — Other Ambulatory Visit: Payer: Self-pay

## 2023-07-22 DIAGNOSIS — C61 Malignant neoplasm of prostate: Secondary | ICD-10-CM | POA: Diagnosis not present

## 2023-07-22 LAB — RAD ONC ARIA SESSION SUMMARY
Course Elapsed Days: 30
Plan Fractions Treated to Date: 23
Plan Prescribed Dose Per Fraction: 2 Gy
Plan Total Fractions Prescribed: 40
Plan Total Prescribed Dose: 80 Gy
Reference Point Dosage Given to Date: 46 Gy
Reference Point Session Dosage Given: 2 Gy
Session Number: 23

## 2023-07-23 ENCOUNTER — Other Ambulatory Visit: Payer: Self-pay

## 2023-07-23 ENCOUNTER — Ambulatory Visit
Admission: RE | Admit: 2023-07-23 | Discharge: 2023-07-23 | Disposition: A | Discharge status: Home / Self Care | Payer: Medicare Other | Source: Ambulatory Visit | Attending: Radiation Oncology | Admitting: Radiation Oncology

## 2023-07-23 DIAGNOSIS — C61 Malignant neoplasm of prostate: Secondary | ICD-10-CM | POA: Diagnosis not present

## 2023-07-23 LAB — RAD ONC ARIA SESSION SUMMARY
Course Elapsed Days: 31
Plan Fractions Treated to Date: 24
Plan Prescribed Dose Per Fraction: 2 Gy
Plan Total Fractions Prescribed: 40
Plan Total Prescribed Dose: 80 Gy
Reference Point Dosage Given to Date: 48 Gy
Reference Point Session Dosage Given: 2 Gy
Session Number: 24

## 2023-07-26 ENCOUNTER — Inpatient Hospital Stay: Payer: Medicare Other

## 2023-07-26 ENCOUNTER — Ambulatory Visit: Admission: RE | Admit: 2023-07-26 | Payer: Medicare Other | Source: Ambulatory Visit

## 2023-07-26 ENCOUNTER — Other Ambulatory Visit: Payer: Self-pay | Admitting: *Deleted

## 2023-07-26 ENCOUNTER — Other Ambulatory Visit: Payer: Self-pay

## 2023-07-26 DIAGNOSIS — C61 Malignant neoplasm of prostate: Secondary | ICD-10-CM

## 2023-07-26 LAB — RAD ONC ARIA SESSION SUMMARY
Course Elapsed Days: 34
Plan Fractions Treated to Date: 25
Plan Prescribed Dose Per Fraction: 2 Gy
Plan Total Fractions Prescribed: 40
Plan Total Prescribed Dose: 80 Gy
Reference Point Dosage Given to Date: 50 Gy
Reference Point Session Dosage Given: 2 Gy
Session Number: 25

## 2023-07-26 LAB — CBC (CANCER CENTER ONLY)
HCT: 40.2 % (ref 39.0–52.0)
Hemoglobin: 13.4 g/dL (ref 13.0–17.0)
MCH: 34.2 pg — ABNORMAL HIGH (ref 26.0–34.0)
MCHC: 33.3 g/dL (ref 30.0–36.0)
MCV: 102.6 fL — ABNORMAL HIGH (ref 80.0–100.0)
Platelet Count: 232 10*3/uL (ref 150–400)
RBC: 3.92 MIL/uL — ABNORMAL LOW (ref 4.22–5.81)
RDW: 12.8 % (ref 11.5–15.5)
WBC Count: 4.1 10*3/uL (ref 4.0–10.5)
nRBC: 0 % (ref 0.0–0.2)

## 2023-07-26 MED ORDER — TAMSULOSIN HCL 0.4 MG PO CAPS: 0.4000 mg | ORAL_CAPSULE | Freq: Every day | ORAL | 4 refills | Status: DC

## 2023-07-27 ENCOUNTER — Ambulatory Visit
Admission: RE | Admit: 2023-07-27 | Discharge: 2023-07-27 | Disposition: A | Discharge status: Home / Self Care | Payer: Medicare Other | Source: Ambulatory Visit | Attending: Radiation Oncology | Admitting: Radiation Oncology

## 2023-07-27 ENCOUNTER — Other Ambulatory Visit: Payer: Self-pay

## 2023-07-27 DIAGNOSIS — C61 Malignant neoplasm of prostate: Secondary | ICD-10-CM | POA: Diagnosis not present

## 2023-07-27 LAB — RAD ONC ARIA SESSION SUMMARY
Course Elapsed Days: 35
Plan Fractions Treated to Date: 26
Plan Prescribed Dose Per Fraction: 2 Gy
Plan Total Fractions Prescribed: 40
Plan Total Prescribed Dose: 80 Gy
Reference Point Dosage Given to Date: 52 Gy
Reference Point Session Dosage Given: 2 Gy
Session Number: 26

## 2023-07-28 ENCOUNTER — Ambulatory Visit: Admission: RE | Admit: 2023-07-28 | Payer: Medicare Other | Source: Ambulatory Visit

## 2023-07-28 ENCOUNTER — Other Ambulatory Visit: Payer: Self-pay

## 2023-07-28 DIAGNOSIS — C61 Malignant neoplasm of prostate: Secondary | ICD-10-CM | POA: Diagnosis not present

## 2023-07-28 LAB — RAD ONC ARIA SESSION SUMMARY
Course Elapsed Days: 36
Plan Fractions Treated to Date: 27
Plan Prescribed Dose Per Fraction: 2 Gy
Plan Total Fractions Prescribed: 40
Plan Total Prescribed Dose: 80 Gy
Reference Point Dosage Given to Date: 54 Gy
Reference Point Session Dosage Given: 2 Gy
Session Number: 27

## 2023-07-29 ENCOUNTER — Ambulatory Visit
Admission: RE | Admit: 2023-07-29 | Discharge: 2023-07-29 | Disposition: A | Discharge status: Home / Self Care | Payer: Medicare Other | Source: Ambulatory Visit | Attending: Radiation Oncology | Admitting: Radiation Oncology

## 2023-07-29 ENCOUNTER — Other Ambulatory Visit: Payer: Self-pay

## 2023-07-29 DIAGNOSIS — C61 Malignant neoplasm of prostate: Secondary | ICD-10-CM | POA: Diagnosis not present

## 2023-07-29 LAB — RAD ONC ARIA SESSION SUMMARY
Course Elapsed Days: 37
Plan Fractions Treated to Date: 28
Plan Prescribed Dose Per Fraction: 2 Gy
Plan Total Fractions Prescribed: 40
Plan Total Prescribed Dose: 80 Gy
Reference Point Dosage Given to Date: 56 Gy
Reference Point Session Dosage Given: 2 Gy
Session Number: 28

## 2023-07-30 ENCOUNTER — Other Ambulatory Visit: Payer: Self-pay

## 2023-07-30 ENCOUNTER — Ambulatory Visit: Admission: RE | Admit: 2023-07-30 | Payer: Medicare Other | Source: Ambulatory Visit

## 2023-07-30 DIAGNOSIS — C61 Malignant neoplasm of prostate: Secondary | ICD-10-CM | POA: Diagnosis not present

## 2023-07-30 LAB — RAD ONC ARIA SESSION SUMMARY
Course Elapsed Days: 38
Plan Fractions Treated to Date: 29
Plan Prescribed Dose Per Fraction: 2 Gy
Plan Total Fractions Prescribed: 40
Plan Total Prescribed Dose: 80 Gy
Reference Point Dosage Given to Date: 58 Gy
Reference Point Session Dosage Given: 2 Gy
Session Number: 29

## 2023-08-03 ENCOUNTER — Ambulatory Visit
Admission: RE | Admit: 2023-08-03 | Discharge: 2023-08-03 | Disposition: A | Discharge status: Home / Self Care | Payer: Medicare Other | Source: Ambulatory Visit | Attending: Radiation Oncology | Admitting: Radiation Oncology

## 2023-08-03 ENCOUNTER — Other Ambulatory Visit: Payer: Self-pay

## 2023-08-03 DIAGNOSIS — C61 Malignant neoplasm of prostate: Secondary | ICD-10-CM | POA: Diagnosis present

## 2023-08-03 DIAGNOSIS — K219 Gastro-esophageal reflux disease without esophagitis: Secondary | ICD-10-CM | POA: Insufficient documentation

## 2023-08-03 DIAGNOSIS — Z79899 Other long term (current) drug therapy: Secondary | ICD-10-CM | POA: Insufficient documentation

## 2023-08-03 DIAGNOSIS — L111 Transient acantholytic dermatosis [Grover]: Secondary | ICD-10-CM | POA: Insufficient documentation

## 2023-08-03 DIAGNOSIS — E785 Hyperlipidemia, unspecified: Secondary | ICD-10-CM | POA: Insufficient documentation

## 2023-08-03 DIAGNOSIS — Z8051 Family history of malignant neoplasm of kidney: Secondary | ICD-10-CM | POA: Insufficient documentation

## 2023-08-03 LAB — RAD ONC ARIA SESSION SUMMARY
Course Elapsed Days: 42
Plan Fractions Treated to Date: 30
Plan Prescribed Dose Per Fraction: 2 Gy
Plan Total Fractions Prescribed: 40
Plan Total Prescribed Dose: 80 Gy
Reference Point Dosage Given to Date: 60 Gy
Reference Point Session Dosage Given: 2 Gy
Session Number: 30

## 2023-08-04 ENCOUNTER — Ambulatory Visit: Admission: RE | Admit: 2023-08-04 | Payer: Medicare Other | Source: Ambulatory Visit

## 2023-08-04 ENCOUNTER — Other Ambulatory Visit: Payer: Self-pay

## 2023-08-04 DIAGNOSIS — C61 Malignant neoplasm of prostate: Secondary | ICD-10-CM | POA: Diagnosis not present

## 2023-08-04 LAB — RAD ONC ARIA SESSION SUMMARY
Course Elapsed Days: 43
Plan Fractions Treated to Date: 31
Plan Prescribed Dose Per Fraction: 2 Gy
Plan Total Fractions Prescribed: 40
Plan Total Prescribed Dose: 80 Gy
Reference Point Dosage Given to Date: 62 Gy
Reference Point Session Dosage Given: 2 Gy
Session Number: 31

## 2023-08-05 ENCOUNTER — Other Ambulatory Visit: Payer: Self-pay

## 2023-08-05 ENCOUNTER — Ambulatory Visit
Admission: RE | Admit: 2023-08-05 | Discharge: 2023-08-05 | Disposition: A | Discharge status: Home / Self Care | Payer: Medicare Other | Source: Ambulatory Visit | Attending: Radiation Oncology | Admitting: Radiation Oncology

## 2023-08-05 DIAGNOSIS — C61 Malignant neoplasm of prostate: Secondary | ICD-10-CM | POA: Diagnosis not present

## 2023-08-05 LAB — RAD ONC ARIA SESSION SUMMARY
Course Elapsed Days: 44
Plan Fractions Treated to Date: 32
Plan Prescribed Dose Per Fraction: 2 Gy
Plan Total Fractions Prescribed: 40
Plan Total Prescribed Dose: 80 Gy
Reference Point Dosage Given to Date: 64 Gy
Reference Point Session Dosage Given: 2 Gy
Session Number: 32

## 2023-08-06 ENCOUNTER — Ambulatory Visit
Admission: RE | Admit: 2023-08-06 | Discharge: 2023-08-06 | Disposition: A | Discharge status: Home / Self Care | Payer: Medicare Other | Source: Ambulatory Visit | Attending: Radiation Oncology | Admitting: Radiation Oncology

## 2023-08-06 ENCOUNTER — Other Ambulatory Visit: Payer: Self-pay

## 2023-08-06 DIAGNOSIS — C61 Malignant neoplasm of prostate: Secondary | ICD-10-CM | POA: Diagnosis not present

## 2023-08-06 LAB — RAD ONC ARIA SESSION SUMMARY
Course Elapsed Days: 45
Plan Fractions Treated to Date: 33
Plan Prescribed Dose Per Fraction: 2 Gy
Plan Total Fractions Prescribed: 40
Plan Total Prescribed Dose: 80 Gy
Reference Point Dosage Given to Date: 66 Gy
Reference Point Session Dosage Given: 2 Gy
Session Number: 33

## 2023-08-09 ENCOUNTER — Other Ambulatory Visit: Payer: Self-pay

## 2023-08-09 ENCOUNTER — Inpatient Hospital Stay: Payer: Medicare Other

## 2023-08-09 ENCOUNTER — Ambulatory Visit
Admission: RE | Admit: 2023-08-09 | Discharge: 2023-08-09 | Disposition: A | Discharge status: Home / Self Care | Payer: Medicare Other | Source: Ambulatory Visit | Attending: Radiation Oncology | Admitting: Radiation Oncology

## 2023-08-09 DIAGNOSIS — C61 Malignant neoplasm of prostate: Secondary | ICD-10-CM | POA: Insufficient documentation

## 2023-08-09 LAB — CBC (CANCER CENTER ONLY)
HCT: 37.8 % — ABNORMAL LOW (ref 39.0–52.0)
Hemoglobin: 12.6 g/dL — ABNORMAL LOW (ref 13.0–17.0)
MCH: 34.8 pg — ABNORMAL HIGH (ref 26.0–34.0)
MCHC: 33.3 g/dL (ref 30.0–36.0)
MCV: 104.4 fL — ABNORMAL HIGH (ref 80.0–100.0)
Platelet Count: 247 10*3/uL (ref 150–400)
RBC: 3.62 MIL/uL — ABNORMAL LOW (ref 4.22–5.81)
RDW: 12.9 % (ref 11.5–15.5)
WBC Count: 5.2 10*3/uL (ref 4.0–10.5)
nRBC: 0 % (ref 0.0–0.2)

## 2023-08-09 LAB — RAD ONC ARIA SESSION SUMMARY
Course Elapsed Days: 48
Plan Fractions Treated to Date: 34
Plan Prescribed Dose Per Fraction: 2 Gy
Plan Total Fractions Prescribed: 40
Plan Total Prescribed Dose: 80 Gy
Reference Point Dosage Given to Date: 68 Gy
Reference Point Session Dosage Given: 2 Gy
Session Number: 34

## 2023-08-10 ENCOUNTER — Ambulatory Visit
Admission: RE | Admit: 2023-08-10 | Discharge: 2023-08-10 | Disposition: A | Discharge status: Home / Self Care | Payer: Medicare Other | Source: Ambulatory Visit | Attending: Radiation Oncology | Admitting: Radiation Oncology

## 2023-08-10 ENCOUNTER — Other Ambulatory Visit: Payer: Self-pay

## 2023-08-10 DIAGNOSIS — C61 Malignant neoplasm of prostate: Secondary | ICD-10-CM | POA: Diagnosis not present

## 2023-08-10 LAB — RAD ONC ARIA SESSION SUMMARY
Course Elapsed Days: 49
Plan Fractions Treated to Date: 35
Plan Prescribed Dose Per Fraction: 2 Gy
Plan Total Fractions Prescribed: 40
Plan Total Prescribed Dose: 80 Gy
Reference Point Dosage Given to Date: 70 Gy
Reference Point Session Dosage Given: 2 Gy
Session Number: 35

## 2023-08-11 ENCOUNTER — Ambulatory Visit
Admission: RE | Admit: 2023-08-11 | Discharge: 2023-08-11 | Disposition: A | Discharge status: Home / Self Care | Payer: Medicare Other | Source: Ambulatory Visit | Attending: Radiation Oncology | Admitting: Radiation Oncology

## 2023-08-11 ENCOUNTER — Other Ambulatory Visit: Payer: Self-pay

## 2023-08-11 DIAGNOSIS — C61 Malignant neoplasm of prostate: Secondary | ICD-10-CM | POA: Diagnosis not present

## 2023-08-11 LAB — RAD ONC ARIA SESSION SUMMARY
Course Elapsed Days: 50
Plan Fractions Treated to Date: 36
Plan Prescribed Dose Per Fraction: 2 Gy
Plan Total Fractions Prescribed: 40
Plan Total Prescribed Dose: 80 Gy
Reference Point Dosage Given to Date: 72 Gy
Reference Point Session Dosage Given: 2 Gy
Session Number: 36

## 2023-08-12 ENCOUNTER — Other Ambulatory Visit: Payer: Self-pay

## 2023-08-12 ENCOUNTER — Ambulatory Visit
Admission: RE | Admit: 2023-08-12 | Discharge: 2023-08-12 | Disposition: A | Discharge status: Home / Self Care | Payer: Medicare Other | Source: Ambulatory Visit | Attending: Radiation Oncology | Admitting: Radiation Oncology

## 2023-08-12 DIAGNOSIS — C61 Malignant neoplasm of prostate: Secondary | ICD-10-CM | POA: Diagnosis not present

## 2023-08-12 LAB — RAD ONC ARIA SESSION SUMMARY
Course Elapsed Days: 51
Plan Fractions Treated to Date: 37
Plan Prescribed Dose Per Fraction: 2 Gy
Plan Total Fractions Prescribed: 40
Plan Total Prescribed Dose: 80 Gy
Reference Point Dosage Given to Date: 74 Gy
Reference Point Session Dosage Given: 2 Gy
Session Number: 37

## 2023-08-13 ENCOUNTER — Other Ambulatory Visit: Payer: Self-pay

## 2023-08-13 ENCOUNTER — Ambulatory Visit
Admission: RE | Admit: 2023-08-13 | Discharge: 2023-08-13 | Disposition: A | Discharge status: Home / Self Care | Payer: Medicare Other | Source: Ambulatory Visit | Attending: Radiation Oncology | Admitting: Radiation Oncology

## 2023-08-13 DIAGNOSIS — C61 Malignant neoplasm of prostate: Secondary | ICD-10-CM | POA: Diagnosis not present

## 2023-08-13 LAB — RAD ONC ARIA SESSION SUMMARY
Course Elapsed Days: 52
Plan Fractions Treated to Date: 38
Plan Prescribed Dose Per Fraction: 2 Gy
Plan Total Fractions Prescribed: 40
Plan Total Prescribed Dose: 80 Gy
Reference Point Dosage Given to Date: 76 Gy
Reference Point Session Dosage Given: 2 Gy
Session Number: 38

## 2023-08-16 ENCOUNTER — Ambulatory Visit
Admission: RE | Admit: 2023-08-16 | Discharge: 2023-08-16 | Disposition: A | Discharge status: Home / Self Care | Payer: Medicare Other | Source: Ambulatory Visit | Attending: Radiation Oncology | Admitting: Radiation Oncology

## 2023-08-16 ENCOUNTER — Other Ambulatory Visit: Payer: Self-pay

## 2023-08-16 DIAGNOSIS — C61 Malignant neoplasm of prostate: Secondary | ICD-10-CM | POA: Diagnosis not present

## 2023-08-16 LAB — RAD ONC ARIA SESSION SUMMARY
Course Elapsed Days: 55
Plan Fractions Treated to Date: 39
Plan Prescribed Dose Per Fraction: 2 Gy
Plan Total Fractions Prescribed: 40
Plan Total Prescribed Dose: 80 Gy
Reference Point Dosage Given to Date: 78 Gy
Reference Point Session Dosage Given: 2 Gy
Session Number: 39

## 2023-08-17 ENCOUNTER — Other Ambulatory Visit: Payer: Self-pay

## 2023-08-17 ENCOUNTER — Ambulatory Visit
Admission: RE | Admit: 2023-08-17 | Discharge: 2023-08-17 | Disposition: A | Discharge status: Home / Self Care | Payer: Medicare Other | Source: Ambulatory Visit | Attending: Radiation Oncology | Admitting: Radiation Oncology

## 2023-08-17 DIAGNOSIS — C61 Malignant neoplasm of prostate: Secondary | ICD-10-CM | POA: Diagnosis not present

## 2023-08-17 LAB — RAD ONC ARIA SESSION SUMMARY
Course Elapsed Days: 56
Plan Fractions Treated to Date: 40
Plan Prescribed Dose Per Fraction: 2 Gy
Plan Total Fractions Prescribed: 40
Plan Total Prescribed Dose: 80 Gy
Reference Point Dosage Given to Date: 80 Gy
Reference Point Session Dosage Given: 2 Gy
Session Number: 40

## 2023-09-27 ENCOUNTER — Ambulatory Visit
Admission: RE | Admit: 2023-09-27 | Discharge: 2023-09-27 | Disposition: A | Discharge status: Home / Self Care | Payer: Medicare Other | Source: Ambulatory Visit | Attending: Radiation Oncology | Admitting: Radiation Oncology

## 2023-09-27 ENCOUNTER — Other Ambulatory Visit: Payer: Self-pay | Admitting: *Deleted

## 2023-09-27 ENCOUNTER — Encounter: Payer: Self-pay | Admitting: Radiation Oncology

## 2023-09-27 VITALS — BP 133/91 | HR 67 | Temp 98.0°F | Resp 16 | Wt 151.0 lb

## 2023-09-27 DIAGNOSIS — C61 Malignant neoplasm of prostate: Secondary | ICD-10-CM | POA: Insufficient documentation

## 2023-09-27 DIAGNOSIS — Z923 Personal history of irradiation: Secondary | ICD-10-CM | POA: Diagnosis not present

## 2023-09-27 DIAGNOSIS — R351 Nocturia: Secondary | ICD-10-CM | POA: Insufficient documentation

## 2023-09-27 NOTE — Progress Notes (Signed)
Radiation Oncology Follow up Note  Name: Brent Kim   Date:   09/27/2023 MRN:  [id number] DOB: 10/03/1941    This 82 y.o. male presents to the clinic today for 1 month follow-up status post image guided IMRT radiation therapy for Gleason 8 (4+4) adenocarcinoma of the prostate presenting with a PSA in the 6 range clinical stage IIc (cT1 cN0 M0).  REFERRING PROVIDER: Jerl Mina, MD  HPI: Patient is an 82 year old male now out 1 month having completed IMRT radiation therapy for Gleason 8 adenocarcinoma presenting with a PSA in the 6 range.  Seen today in routine follow-up he is doing well.  He has nocturia x 4 which is a slight increase from average nocturia of 2-3 prior to radiation.  He is having no bowel problems.  He is currently on Flomax..  COMPLICATIONS OF TREATMENT: none  FOLLOW UP COMPLIANCE: keeps appointments   PHYSICAL EXAM:  BP (!) 133/91 Comment: Patient wilkl monitor at home and contact PCP if it remains elevated  Pulse 67   Temp 98 F (36.7 C) (Tympanic)   Resp 16   Wt 151 lb (68.5 kg)   BMI 22.96 kg/m  Well-developed well-nourished patient in NAD. HEENT reveals PERLA, EOMI, discs not visualized.  Oral cavity is clear. No oral mucosal lesions are identified. Neck is clear without evidence of cervical or supraclavicular adenopathy. Lungs are clear to A&P. Cardiac examination is essentially unremarkable with regular rate and rhythm without murmur rub or thrill. Abdomen is benign with no organomegaly or masses noted. Motor sensory and DTR levels are equal and symmetric in the upper and lower extremities. Cranial nerves II through XII are grossly intact. Proprioception is intact. No peripheral adenopathy or edema is identified. No motor or sensory levels are noted. Crude visual fields are within normal range.  RADIOLOGY RESULTS: No current films for review  PLAN: Present time of asked the patient to return in 3 months with a PSA prior to that visit.  He will continue  on Flomax at this time.  He already has follow-up appointments with urology.  Patient is to call with any concerns.  Will refill his Flomax as needed.  I would like to take this opportunity to thank you for allowing me to participate in the care of your patient.Carmina Miller, MD

## 2023-12-03 ENCOUNTER — Other Ambulatory Visit: Payer: Medicare Other

## 2023-12-03 ENCOUNTER — Other Ambulatory Visit: Payer: Self-pay

## 2023-12-03 DIAGNOSIS — C61 Malignant neoplasm of prostate: Secondary | ICD-10-CM

## 2023-12-04 LAB — PSA: Prostate Specific Ag, Serum: <0.1 ng/mL (ref 0.0–4.0)

## 2023-12-06 ENCOUNTER — Ambulatory Visit (INDEPENDENT_AMBULATORY_CARE_PROVIDER_SITE_OTHER): Payer: Medicare Other | Admitting: Urology

## 2023-12-06 VITALS — BP 122/78 | HR 75 | Ht 68.0 in | Wt 145.0 lb

## 2023-12-06 DIAGNOSIS — C61 Malignant neoplasm of prostate: Secondary | ICD-10-CM

## 2023-12-06 NOTE — Progress Notes (Signed)
 I,Amy L Pierron,acting as a scribe for Glendia JAYSON Barba, MD.,have documented all relevant documentation on the behalf of Glendia JAYSON Barba, MD,as directed by  Glendia JAYSON Barba, MD while in the presence of Glendia JAYSON Barba, MD.  12/06/2023 6:17 PM   Brent Kim 21-Jan-1941 [id number]  Referring provider: Valora Agent, MD [address] [facility] [place],  [place] [address]  Chief Complaint  Patient presents with   Prostate Cancer   Urologic History:  1. Prostate cancer Prostate MRI PI-RADS 5 lesion left anterior peripheral zone, March 2024; PSA 5.7. TRUS/ biopsy 04/14/23 showed Gleason 4+3/ 4+4/ 3+3. Refer to office note 04/29/2023 for details. Elected IMRT +6 months ADT completed September 2024.  HPI: Brent Kim is a 83 y.o. male presents for follow-up visit.  No complaints. He is walking 5-6 miles per day and has a good energy level without fatigue. Urinary frequency, urgency, and nocturia, though not bothersome. Currently taking Tamsulosin . No dysuria, gross hematuria. No flank, abdominal, or pelvic pain. PSA 12/03/2023 undetectable at <0.1  PMH: Past Medical History:  Diagnosis Date   Erectile dysfunction    GERD (gastroesophageal reflux disease)    Grover's disease    Hyperlipemia    Pneumonia    Urethral stricture     Surgical History: Past Surgical History:  Procedure Laterality Date   CATARACT EXTRACTION W/PHACO Left 09/26/2019   Procedure: CATARACT EXTRACTION PHACO AND INTRAOCULAR LENS PLACEMENT (IOC) LEFT PANOPTIX TORIC;  Surgeon: Jaye Fallow, MD;  Location: [facility];  Service: Ophthalmology;  Laterality: Left;  0:46 16.3% 7.54   CATARACT EXTRACTION W/PHACO Right 11/12/2020   Procedure: CATARACT EXTRACTION PHACO AND INTRAOCULAR LENS PLACEMENT (IOC) RIGHT EYHANCE TORIC 6.52 00:43.9;  Surgeon: Jaye Fallow, MD;  Location: [facility];  Service: Ophthalmology;  Laterality: Right;   COLONOSCOPY WITH PROPOFOL  N/A  07/15/2015   Procedure: COLONOSCOPY WITH PROPOFOL ;  Surgeon: Lamar ONEIDA Holmes, MD;  Location[facility];  Service: Endoscopy;  Laterality: N/A;   CYSTOSCOPY WITH DIRECT VISION INTERNAL URETHROTOMY     CYSTOURETHROSCOPY     DESTRUCTION LESION PENILE     VASECTOMY      Home Medications:  Allergies as of 12/06/2023       Reactions   Balsam Other (See Comments)   Other reaction(s): Other (See Comments) Positive patch test Positive patch test   Cetearyl Other (See Comments)   Other reaction(s): Other (See Comments) Positive patch test Positive patch test   Decyl Glucoside Other (See Comments)   Other reaction(s): Other (See Comments) Positive patch test Positive patch test   Linalool Other (See Comments)   Other reaction(s): Other (See Comments) Positive patch test Positive patch test        Medication List        Accurate as of December 06, 2023  6:17 PM. If you have any questions, ask your nurse or doctor.          STOP taking these medications    sildenafil 100 MG tablet Commonly known as: VIAGRA       TAKE these medications    cholecalciferol  1000 units tablet Commonly known as: VITAMIN D Take 1,000 Units by mouth daily.   loratadine  10 MG tablet Commonly known as: CLARITIN  Take 10 mg by mouth daily.   multivitamin tablet Take 1 tablet by mouth daily.   omeprazole  20 MG capsule Commonly known as: PRILOSEC  Take 20 mg by mouth daily.   tamsulosin  0.4 MG Caps capsule Commonly known as: FLOMAX  Take  1 capsule (0.4 mg total) by mouth daily after supper.   traZODone  50 MG tablet Commonly known as: DESYREL  Take 50 mg by mouth at bedtime.        Allergies:  Allergies  Allergen Reactions   Balsam Other (See Comments)    Other reaction(s): Other (See Comments) Positive patch test Positive patch test    Cetearyl Other (See Comments)    Other reaction(s): Other (See Comments) Positive patch test Positive patch test    Decyl Glucoside Other  (See Comments)    Other reaction(s): Other (See Comments) Positive patch test Positive patch test    Linalool Other (See Comments)    Other reaction(s): Other (See Comments) Positive patch test Positive patch test     Family History: Family History  Problem Relation Age of Onset   Kidney cancer Mother    Lung cancer Mother    Stroke Father     Social History:  reports that he has never smoked. He has never used smokeless tobacco. He reports current alcohol use of about 14.0 standard drinks of alcohol per week. No history on file for drug use.   Physical Exam: BP 122/78   Pulse 75   Ht 5' 8 (1.727 m)   Wt 145 lb (65.8 kg)   BMI 22.05 kg/m   Constitutional:  Alert and oriented, No acute distress. HEENT: [place] AT Respiratory: Normal respiratory effort, no increased work of breathing. Psychiatric: Normal mood and affect.   Assessment & Plan:    1. High risk prostate cancer Undetectable PSA. He has radiation oncology follow-up in 3 months and will see him back in July 2025 with a PSA/ testosterone  level.    I have reviewed the above documentation for accuracy and completeness, and I agree with the above.   Glendia JAYSON Barba, MD  [facility] [address] [place], [place] [address] [phone]

## 2023-12-10 ENCOUNTER — Other Ambulatory Visit: Payer: Medicare Other

## 2023-12-11 ENCOUNTER — Encounter: Payer: Self-pay | Admitting: Urology

## 2023-12-13 ENCOUNTER — Ambulatory Visit: Payer: Medicare Other | Admitting: Urology

## 2023-12-14 ENCOUNTER — Other Ambulatory Visit: Payer: Self-pay | Admitting: *Deleted

## 2023-12-14 ENCOUNTER — Other Ambulatory Visit: Payer: Self-pay | Admitting: Radiation Oncology

## 2023-12-14 MED ORDER — TAMSULOSIN HCL 0.4 MG PO CAPS: 0.4000 mg | ORAL_CAPSULE | Freq: Every day | ORAL | 4 refills | Status: DC

## 2023-12-22 ENCOUNTER — Other Ambulatory Visit: Payer: Medicare Other

## 2023-12-29 ENCOUNTER — Ambulatory Visit: Payer: Medicare Other | Admitting: Radiation Oncology

## 2024-02-09 ENCOUNTER — Other Ambulatory Visit: Payer: Medicare Other

## 2024-02-16 ENCOUNTER — Ambulatory Visit: Payer: Medicare Other | Admitting: Urology

## 2024-03-22 ENCOUNTER — Inpatient Hospital Stay: Payer: Medicare Other | Attending: Radiation Oncology

## 2024-03-22 DIAGNOSIS — C61 Malignant neoplasm of prostate: Secondary | ICD-10-CM | POA: Diagnosis present

## 2024-03-22 LAB — PSA: Prostatic Specific Antigen: 0.02 ng/mL (ref 0.00–4.00)

## 2024-03-29 ENCOUNTER — Ambulatory Visit: Payer: Medicare Other | Admitting: Radiation Oncology

## 2024-04-05 ENCOUNTER — Encounter: Payer: Self-pay | Admitting: Radiation Oncology

## 2024-04-05 ENCOUNTER — Ambulatory Visit
Admission: RE | Admit: 2024-04-05 | Discharge: 2024-04-05 | Disposition: A | Discharge status: Home / Self Care | Source: Ambulatory Visit | Attending: Radiation Oncology | Admitting: Radiation Oncology

## 2024-04-05 ENCOUNTER — Other Ambulatory Visit: Payer: Self-pay | Admitting: *Deleted

## 2024-04-05 VITALS — BP 121/82 | HR 75 | Temp 97.0°F | Resp 16 | Wt 156.0 lb

## 2024-04-05 DIAGNOSIS — C61 Malignant neoplasm of prostate: Secondary | ICD-10-CM | POA: Insufficient documentation

## 2024-04-05 DIAGNOSIS — Z923 Personal history of irradiation: Secondary | ICD-10-CM | POA: Insufficient documentation

## 2024-04-05 NOTE — Progress Notes (Signed)
 Radiation Oncology Follow up Note  Name: Brent Kim   Date:   04/05/2024 MRN:  [id number] DOB: [birth year]    This 83 y.o. male presents to the clinic today for 40-month follow-up status post image guided IMRT radiation therapy for Gleason 8 (4+4) adenocarcinoma prostate presenting with a PSA in the 6 range.  REFERRING PROVIDER: Lyle San, MD  HPI: Patient is an 83 year old male now out 7 months having completed image guided IMRT radiation therapy for Gleason 8 adenocarcinoma presented with a PSA in the 6 range.  He is seen today in routine follow-up is doing well.  He specifically [place] any increased lower urinary tract symptoms diarrhea or fatigue.Aaron Aas  He is currently on Flomax  which he is tolerating well.  His most recent PSA is 0.02  COMPLICATIONS OF TREATMENT: none  FOLLOW UP COMPLIANCE: keeps appointments   PHYSICAL EXAM:  BP 121/82   Pulse 75   Temp (!) 97 F (36.1 C)   Resp 16   Wt 156 lb (70.8 kg)   BMI 23.72 kg/m  Well-developed well-nourished patient in NAD. HEENT reveals PERLA, EOMI, discs not visualized.  Oral cavity is clear. No oral mucosal lesions are identified. Neck is clear without evidence of cervical or supraclavicular adenopathy. Lungs are clear to A&P. Cardiac examination is essentially unremarkable with regular rate and rhythm without murmur rub or thrill. Abdomen is benign with no organomegaly or masses noted. Motor sensory and DTR levels are equal and symmetric in the upper and lower extremities. Cranial nerves II through XII are grossly intact. Proprioception is intact. No peripheral adenopathy or edema is identified. No motor or sensory levels are noted. Crude visual fields are within normal range.  RADIOLOGY RESULTS: No current films to review  PLAN: The present time patient is under excellent biochemical control of his prostate cancer and pleased with his overall progress.  Of asked to see him back in 6 months with a follow-up PSA.  Patient knows to  call sooner with any concerns.  I would like to take this opportunity to thank you for allowing me to participate in the care of your patient.Glenis Langdon, MD

## 2024-05-29 ENCOUNTER — Other Ambulatory Visit: Payer: Self-pay | Admitting: *Deleted

## 2024-05-29 DIAGNOSIS — R972 Elevated prostate specific antigen [PSA]: Secondary | ICD-10-CM

## 2024-05-29 DIAGNOSIS — C61 Malignant neoplasm of prostate: Secondary | ICD-10-CM

## 2024-05-31 ENCOUNTER — Other Ambulatory Visit: Payer: Self-pay

## 2024-05-31 DIAGNOSIS — R972 Elevated prostate specific antigen [PSA]: Secondary | ICD-10-CM

## 2024-05-31 DIAGNOSIS — C61 Malignant neoplasm of prostate: Secondary | ICD-10-CM

## 2024-06-02 LAB — PSA: Prostate Specific Ag, Serum: <0.1 ng/mL (ref 0.0–4.0)

## 2024-06-02 LAB — TESTOSTERONE: Testosterone: 91 ng/dL — ABNORMAL LOW (ref 264–916)

## 2024-06-06 ENCOUNTER — Other Ambulatory Visit: Payer: Medicare Other

## 2024-06-07 ENCOUNTER — Encounter: Payer: Self-pay | Admitting: Urology

## 2024-06-07 ENCOUNTER — Ambulatory Visit (INDEPENDENT_AMBULATORY_CARE_PROVIDER_SITE_OTHER): Payer: Self-pay | Admitting: Urology

## 2024-06-07 VITALS — BP 129/83 | HR 72 | Ht 68.0 in | Wt 150.0 lb

## 2024-06-07 DIAGNOSIS — C61 Malignant neoplasm of prostate: Secondary | ICD-10-CM

## 2024-06-07 NOTE — Progress Notes (Signed)
 06/07/2024 9:03 AM   Brent Kim 11-Jan-1941 [id number]  Referring provider: Valora Agent, MD [address] [place],  [place] [address]  Chief Complaint  Patient presents with   Prostate Cancer   Urologic History:  1. Prostate cancer Prostate MRI PI-RADS 5 lesion left anterior peripheral zone, March 2024; PSA 5.7. TRUS/ biopsy 04/14/23 showed Gleason 4+3/ 4+4/ 3+3. Refer to office note 04/29/2023 for details. Elected IMRT +6 months ADT completed September 2024.  HPI: Brent Kim is a 83 y.o. male presents for follow-up visit.  No problems since last visit He states 9 months posttreatment his voiding symptoms were back to baseline; discontinued tamsulosin  ~1 week ago. No dysuria, gross hematuria. No flank, abdominal, or pelvic pain. He saw Dr. Lenn May 2025 and PSA was 0.02 PSA was drawn prior to our appointment on 05/31/2024 and was undetectable at <0.1.  Testosterone  level was 91.    PMH: Past Medical History:  Diagnosis Date   Erectile dysfunction    GERD (gastroesophageal reflux disease)    Grover's disease    Hyperlipemia    Pneumonia    Urethral stricture     Surgical History: Past Surgical History:  Procedure Laterality Date   CATARACT EXTRACTION W/PHACO Left 09/26/2019   Procedure: CATARACT EXTRACTION PHACO AND INTRAOCULAR LENS PLACEMENT (IOC) LEFT PANOPTIX TORIC;  Surgeon: Jaye Fallow, MD;  Location: [facility];  Service: Ophthalmology;  Laterality: Left;  0:46 16.3% 7.54   CATARACT EXTRACTION W/PHACO Right 11/12/2020   Procedure: CATARACT EXTRACTION PHACO AND INTRAOCULAR LENS PLACEMENT (IOC) RIGHT EYHANCE TORIC 6.52 00:43.9;  Surgeon: Jaye Fallow, MD;  Location: [facility];  Service: Ophthalmology;  Laterality: Right;   COLONOSCOPY WITH PROPOFOL  N/A 07/15/2015   Procedure: COLONOSCOPY WITH PROPOFOL ;  Surgeon: Lamar ONEIDA Holmes, MD;  Location: [facility] ENDOSCOPY;  Service: Endoscopy;  Laterality: N/A;    CYSTOSCOPY WITH DIRECT VISION INTERNAL URETHROTOMY     CYSTOURETHROSCOPY     DESTRUCTION LESION PENILE     VASECTOMY      Home Medications:  Allergies as of 06/07/2024       Reactions   Balsam Other (See Comments)   Other reaction(s): Other (See Comments) Positive patch test Positive patch test   Cetearyl Other (See Comments)   Other reaction(s): Other (See Comments) Positive patch test Positive patch test   Decyl Glucoside Other (See Comments)   Other reaction(s): Other (See Comments) Positive patch test Positive patch test   Linalool Other (See Comments)   Other reaction(s): Other (See Comments) Positive patch test Positive patch test        Medication List        Accurate as of June 07, 2024  9:03 AM. If you have any questions, ask your nurse or doctor.          cholecalciferol  1000 units tablet Commonly known as: VITAMIN D Take 1,000 Units by mouth daily.   loratadine  10 MG tablet Commonly known as: CLARITIN  Take 10 mg by mouth daily.   multivitamin tablet Take 1 tablet by mouth daily.   omeprazole 20 MG capsule Commonly known as: PRILOSEC Take 20 mg by mouth daily.   tamsulosin  0.4 MG Caps capsule Commonly known as: FLOMAX  Take 1 capsule (0.4 mg total) by mouth daily after supper.   traZODone  50 MG tablet Commonly known as: DESYREL  Take 50 mg by mouth at bedtime.        Allergies:  Allergies  Allergen Reactions   Balsam Other (See Comments)  Other reaction(s): Other (See Comments) Positive patch test Positive patch test    Cetearyl Other (See Comments)    Other reaction(s): Other (See Comments) Positive patch test Positive patch test    Decyl Glucoside Other (See Comments)    Other reaction(s): Other (See Comments) Positive patch test Positive patch test    Linalool Other (See Comments)    Other reaction(s): Other (See Comments) Positive patch test Positive patch test     Family History: Family History  Problem  Relation Age of Onset   Kidney cancer Mother    Lung cancer Mother    Stroke Father     Social History:  reports that he has never smoked. He has never used smokeless tobacco. He reports current alcohol use of about 14.0 standard drinks of alcohol per week. No history on file for drug use.   Physical Exam: BP 129/83   Pulse 72   Ht 5' 8 (1.727 m)   Wt 150 lb (68 kg)   BMI 22.81 kg/m   Constitutional:  Alert and oriented, No acute distress. HEENT: [place] AT Respiratory: Normal respiratory effort, no increased work of breathing. Psychiatric: Normal mood and affect.   Assessment & Plan:    1. High risk prostate cancer Undetectable PSA. He has a follow-up appointment scheduled with the radiation oncology November 2025 and will see him back May 2026 with PSA    Glendia JAYSON Barba, MD  [facility] [address] [place], [place] [address] [phone]

## 2024-06-12 ENCOUNTER — Other Ambulatory Visit: Payer: Medicare Other

## 2024-09-08 ENCOUNTER — Emergency Department
Admission: EM | Admit: 2024-09-08 | Discharge: 2024-09-08 | Disposition: A | Discharge status: Home / Self Care | Attending: Emergency Medicine | Admitting: Emergency Medicine

## 2024-09-08 ENCOUNTER — Emergency Department

## 2024-09-08 ENCOUNTER — Encounter: Payer: Self-pay | Admitting: Emergency Medicine

## 2024-09-08 ENCOUNTER — Other Ambulatory Visit: Payer: Self-pay

## 2024-09-08 DIAGNOSIS — Z8546 Personal history of malignant neoplasm of prostate: Secondary | ICD-10-CM | POA: Diagnosis not present

## 2024-09-08 DIAGNOSIS — R1010 Upper abdominal pain, unspecified: Secondary | ICD-10-CM

## 2024-09-08 DIAGNOSIS — R0602 Shortness of breath: Secondary | ICD-10-CM | POA: Insufficient documentation

## 2024-09-08 DIAGNOSIS — R079 Chest pain, unspecified: Secondary | ICD-10-CM

## 2024-09-08 DIAGNOSIS — R112 Nausea with vomiting, unspecified: Secondary | ICD-10-CM

## 2024-09-08 DIAGNOSIS — N390 Urinary tract infection, site not specified: Secondary | ICD-10-CM | POA: Insufficient documentation

## 2024-09-08 DIAGNOSIS — K8020 Calculus of gallbladder without cholecystitis without obstruction: Secondary | ICD-10-CM | POA: Diagnosis not present

## 2024-09-08 LAB — RESP PANEL BY RT-PCR (RSV, FLU A&B, COVID)  RVPGX2
Influenza A by PCR: NEGATIVE
Influenza B by PCR: NEGATIVE
Resp Syncytial Virus by PCR: NEGATIVE
SARS Coronavirus 2 by RT PCR: NEGATIVE

## 2024-09-08 LAB — URINALYSIS, ROUTINE W REFLEX MICROSCOPIC
Bilirubin Urine: NEGATIVE
Glucose, UA: NEGATIVE mg/dL
Ketones, ur: NEGATIVE mg/dL
Nitrite: POSITIVE — AB
Protein, ur: NEGATIVE mg/dL
Specific Gravity, Urine: 1.018 (ref 1.005–1.030)
WBC, UA: >50 WBC/hpf (ref 0–5)
pH: 5 (ref 5.0–8.0)

## 2024-09-08 LAB — CBC
HCT: 39 % (ref 39.0–52.0)
Hemoglobin: 13 g/dL (ref 13.0–17.0)
MCH: 34.9 pg — ABNORMAL HIGH (ref 26.0–34.0)
MCHC: 33.3 g/dL (ref 30.0–36.0)
MCV: 104.6 fL — ABNORMAL HIGH (ref 80.0–100.0)
Platelets: 242 K/uL (ref 150–400)
RBC: 3.73 MIL/uL — ABNORMAL LOW (ref 4.22–5.81)
RDW: 13 % (ref 11.5–15.5)
WBC: 7.5 K/uL (ref 4.0–10.5)
nRBC: 0 % (ref 0.0–0.2)

## 2024-09-08 LAB — LIPASE, BLOOD: Lipase: 23 U/L (ref 11–51)

## 2024-09-08 LAB — COMPREHENSIVE METABOLIC PANEL WITH GFR
ALT: 17 U/L (ref 0–44)
AST: 23 U/L (ref 15–41)
Albumin: 3.7 g/dL (ref 3.5–5.0)
Alkaline Phosphatase: 51 U/L (ref 38–126)
Anion gap: 10 (ref 5–15)
BUN: 20 mg/dL (ref 8–23)
CO2: 26 mmol/L (ref 22–32)
Calcium: 9.1 mg/dL (ref 8.9–10.3)
Chloride: 102 mmol/L (ref 98–111)
Creatinine, Ser: 0.97 mg/dL (ref 0.61–1.24)
GFR, Estimated: >60 mL/min (ref >60)
Glucose, Bld: 151 mg/dL — ABNORMAL HIGH (ref 70–99)
Potassium: 4.1 mmol/L (ref 3.5–5.1)
Sodium: 138 mmol/L (ref 135–145)
Total Bilirubin: 0.6 mg/dL (ref 0.0–1.2)
Total Protein: 6.9 g/dL (ref 6.5–8.1)

## 2024-09-08 LAB — TROPONIN I (HIGH SENSITIVITY): Troponin I (High Sensitivity): 6 ng/L (ref <18)

## 2024-09-08 LAB — LACTIC ACID, PLASMA: Lactic Acid, Venous: 0.7 mmol/L (ref 0.5–1.9)

## 2024-09-08 MED ORDER — ONDANSETRON 4 MG PO TBDP: 4.0000 mg | ORAL_TABLET | Freq: Three times a day (TID) | ORAL | 0 refills | Status: DC | PRN

## 2024-09-08 MED ORDER — ACETAMINOPHEN 500 MG PO TABS
1000.0000 mg | ORAL_TABLET | Freq: Once | ORAL | Status: AC
Start: 2024-09-08 — End: 2024-09-08
  Administered 2024-09-08: 1000 mg via ORAL
  Filled 2024-09-08: qty 2

## 2024-09-08 MED ORDER — TRAMADOL HCL 50 MG PO TABS: 50.0000 mg | ORAL_TABLET | Freq: Four times a day (QID) | ORAL | 0 refills | Status: DC | PRN

## 2024-09-08 MED ORDER — FENTANYL CITRATE (PF) 50 MCG/ML IJ SOSY
50.0000 ug | PREFILLED_SYRINGE | Freq: Once | INTRAMUSCULAR | Status: AC
  Administered 2024-09-08: 50 ug via INTRAVENOUS
  Filled 2024-09-08: qty 1

## 2024-09-08 MED ORDER — LACTATED RINGERS IV BOLUS
1000.0000 mL | Freq: Once | INTRAVENOUS | Status: AC
  Administered 2024-09-08: 1000 mL via INTRAVENOUS

## 2024-09-08 MED ORDER — SODIUM CHLORIDE 0.9 % IV SOLN
2.0000 g | Freq: Once | INTRAVENOUS | Status: AC
  Administered 2024-09-08: 2 g via INTRAVENOUS
  Filled 2024-09-08: qty 20

## 2024-09-08 MED ORDER — ONDANSETRON HCL 4 MG/2ML IJ SOLN
4.0000 mg | Freq: Once | INTRAMUSCULAR | Status: AC
  Administered 2024-09-08: 4 mg via INTRAVENOUS
  Filled 2024-09-08: qty 2

## 2024-09-08 MED ORDER — OMEPRAZOLE MAGNESIUM 20 MG PO TBEC: 40.0000 mg | DELAYED_RELEASE_TABLET | Freq: Every day | ORAL | 0 refills | Status: AC

## 2024-09-08 MED ORDER — SUCRALFATE 1 G PO TABS: 1.0000 g | ORAL_TABLET | Freq: Three times a day (TID) | ORAL | 0 refills | Status: AC

## 2024-09-08 MED ORDER — CEPHALEXIN 500 MG PO CAPS: 500.0000 mg | ORAL_CAPSULE | Freq: Three times a day (TID) | ORAL | 0 refills | Status: AC

## 2024-09-08 NOTE — ED Notes (Signed)
 See triage note, pt reports overall not feeling well since having COVID vaccine a few weeks ago. NAD noted.

## 2024-09-08 NOTE — ED Provider Notes (Signed)
 [facility] Provider Note    Event Date/Time   First MD Initiated Contact with Patient 09/08/24 [phone]     (approximate)   History   Abdominal Pain and Chest Pain   HPI  Brent Kim is a 83 y.o. male with history of prostate cancer, hyperlipidemia, Grovers disease, GERD who presents to the emergency department with complaints of upper abdominal pain, chest pain, vomiting.  Did have shortness of breath but no cough, congestion.  Low-grade temperature here of 100.  Denies fevers, chills at home.  No prior abdominal surgery.  No urinary symptoms.  No bloody stools, melena, diarrhea.  No prior history of PE or DVT.  No calf tenderness or calf swelling.   History provided by patient and wife.    Past Medical History:  Diagnosis Date   Erectile dysfunction    GERD (gastroesophageal reflux disease)    Grover's disease    Hyperlipemia    Pneumonia    Urethral stricture     Past Surgical History:  Procedure Laterality Date   CATARACT EXTRACTION W/PHACO Left 09/26/2019   Procedure: CATARACT EXTRACTION PHACO AND INTRAOCULAR LENS PLACEMENT (IOC) LEFT PANOPTIX TORIC;  Surgeon: Jaye Fallow, MD;  Location: [facility];  Service: Ophthalmology;  Laterality: Left;  0:46 16.3% 7.54   CATARACT EXTRACTION W/PHACO Right 11/12/2020   Procedure: CATARACT EXTRACTION PHACO AND INTRAOCULAR LENS PLACEMENT (IOC) RIGHT EYHANCE TORIC 6.52 00:43.9;  Surgeon: Jaye Fallow, MD;  Location: [facility];  Service: Ophthalmology;  Laterality: Right;   COLONOSCOPY WITH PROPOFOL  N/A 07/15/2015   Procedure: COLONOSCOPY WITH PROPOFOL ;  Surgeon: Lamar ONEIDA Holmes, MD;  Location: [facility]COPY;  Service: Endoscopy;  Laterality: N/A;   CYSTOSCOPY WITH DIRECT VISION INTERNAL URETHROTOMY     CYSTOURETHROSCOPY     DESTRUCTION LESION PENILE     VASECTOMY      MEDICATIONS:  Prior to Admission medications   Medication Sig Start Date End Date Taking? Authorizing  Provider  cholecalciferol  (VITAMIN D) 1000 UNITS tablet Take 1,000 Units by mouth daily.    [provider]  loratadine  (CLARITIN ) 10 MG tablet Take 10 mg by mouth daily.    [provider]  Multiple Vitamin (MULTIVITAMIN) tablet Take 1 tablet by mouth daily.    [provider]  omeprazole (PRILOSEC) 20 MG capsule Take 20 mg by mouth daily.    [provider]  tamsulosin  (FLOMAX ) 0.4 MG CAPS capsule Take 1 capsule (0.4 mg total) by mouth daily after supper. 12/14/23   Lenn Aran, MD  traZODone  (DESYREL ) 50 MG tablet Take 50 mg by mouth at bedtime.    [provider]    Physical Exam   Triage Vital Signs: ED Triage Vitals  Encounter Vitals Group     BP 09/08/24 0502 (!) 146/87     Girls Systolic BP Percentile --      Girls Diastolic BP Percentile --      Boys Systolic BP Percentile --      Boys Diastolic BP Percentile --      Pulse Rate 09/08/24 0502 83     Resp 09/08/24 0502 18     Temp 09/08/24 0502 99.9 F (37.7 C)     Temp Source 09/08/24 0502 Oral     SpO2 09/08/24 0502 93 %     Weight --      Height --      Head Circumference --      Peak Flow --      Pain  Score 09/08/24 0501 5     Pain Loc --      Pain Education --      Exclude from Growth Chart --     Most recent vital signs: Vitals:   09/08/24 0502 09/08/24 0535  BP: (!) 146/87   Pulse: 83   Resp: 18   Temp: 99.9 F (37.7 C) 100 F (37.8 C)  SpO2: 93%     CONSTITUTIONAL: Alert, responds appropriately to questions. Well-appearing; well-nourished, appears younger than stated age HEAD: Normocephalic, atraumatic EYES: Conjunctivae clear, pupils appear equal, sclera nonicteric ENT: normal nose; moist mucous membranes NECK: Supple, normal ROM, no meningismus CARD: RRR; S1 and S2 appreciated RESP: Normal chest excursion without splinting or tachypnea; breath sounds clear and equal bilaterally; no wheezes, no rhonchi, no rales, no hypoxia or respiratory distress,  speaking full sentences ABD/GI: Non-distended; soft, tender throughout the upper abdomen, positive Murphy sign, no tenderness at McBurney's point BACK: The back appears normal EXT: Normal ROM in all joints; no deformity noted, no edema, no calf tenderness or calf swelling SKIN: Normal color for age and race; warm; no rash on exposed skin NEURO: Moves all extremities equally, normal speech, no facial asymmetry, normal gait PSYCH: The patient's mood and manner are appropriate.   ED Results / Procedures / Treatments   LABS: (all labs ordered are listed, but only abnormal results are displayed) Labs Reviewed  COMPREHENSIVE METABOLIC PANEL WITH GFR - Abnormal; Notable for the following components:      Result Value   Glucose, Bld 151 (*)    All other components within normal limits  CBC - Abnormal; Notable for the following components:   RBC 3.73 (*)    MCV 104.6 (*)    MCH 34.9 (*)    All other components within normal limits  URINALYSIS, ROUTINE W REFLEX MICROSCOPIC - Abnormal; Notable for the following components:   Color, Urine YELLOW (*)    APPearance HAZY (*)    Hgb urine dipstick SMALL (*)    Nitrite POSITIVE (*)    Leukocytes,Ua MODERATE (*)    Bacteria, UA FEW (*)    All other components within normal limits  RESP PANEL BY RT-PCR (RSV, FLU A&B, COVID)  RVPGX2  CULTURE, BLOOD (ROUTINE X 2)  CULTURE, BLOOD (ROUTINE X 2)  URINE CULTURE  LIPASE, BLOOD  LACTIC ACID, PLASMA  TROPONIN I (HIGH SENSITIVITY)  TROPONIN I (HIGH SENSITIVITY)     EKG:  EKG Interpretation Date/Time:  Friday September 08 2024 05:00:22 EDT Ventricular Rate:  60 PR Interval:  176 QRS Duration:  108 QT Interval:  442 QTC Calculation: 442 R Axis:   -77  Text Interpretation: Normal sinus rhythm Left anterior fascicular block Septal infarct , age undetermined Abnormal ECG When compared with ECG of 02-Jun-2019 12:38, PREVIOUS ECG IS PRESENT No significant change since last tracing Confirmed by Neomi Neptune [phone]) on 09/08/2024 5:27:45 AM         RADIOLOGY: My personal review and interpretation of imaging: Chest x-ray shows no pneumonia.  Ultrasound pending.  I have personally reviewed all radiology reports.   DG Chest Portable 1 View Result Date: 09/08/2024 EXAM: 1 VIEW XRAY OF THE CHEST 09/08/2024 06:03:00 AM COMPARISON: 06/02/2019 CLINICAL HISTORY: CP, fever. The patient presents with fever, CP, and abdominal pain. Hx of pneumonia. FINDINGS: LUNGS AND PLEURA: No focal pulmonary opacity. No pulmonary edema. No pleural effusion. No pneumothorax. HEART AND MEDIASTINUM: No acute abnormality of the cardiac and mediastinal silhouettes. BONES AND SOFT TISSUES:  No acute osseous abnormality. IMPRESSION: 1. No acute process. Electronically signed by: Waddell Calk MD 09/08/2024 06:27 AM EDT RP Workstation: [id number]     PROCEDURES:  Critical Care performed: No    .1-3 Lead EKG Interpretation  Performed by: Mayo Faulk, Josette SAILOR, DO Authorized by: Luellen Howson, Josette SAILOR, DO     Interpretation: normal     ECG rate:  83   ECG rate assessment: normal     Rhythm: sinus rhythm     Ectopy: none     Conduction: normal       IMPRESSION / MDM / ASSESSMENT AND PLAN / ED COURSE  I reviewed the triage vital signs and the nursing notes.    Patient here with complaints of upper abdominal pain, vomiting.  Temperature here of 100.  The patient is on the cardiac monitor to evaluate for evidence of arrhythmia and/or significant heart rate changes.   DIFFERENTIAL DIAGNOSIS (includes but not limited to):   Cholelithiasis, cholecystitis, gastritis, viral gastroenteritis, pneumonia, PE, ACS, sepsis, UTI, bacteremia   Patient's presentation is most consistent with acute presentation with potential threat to life or bodily function.   PLAN: Will obtain labs, cultures, urine, troponin x 2.  EKG shows no new ischemic changes.  No risk factors for PE other than age.  Will obtain chest x-ray, right upper  quadrant ultrasound.  Will give antipyretics, pain and nausea medicine, IV fluids.   MEDICATIONS GIVEN IN ED: Medications  cefTRIAXone (ROCEPHIN) 2 g in sodium chloride  0.9 % 100 mL IVPB (0 g Intravenous Stopped 09/08/24 0648)  acetaminophen  (TYLENOL ) tablet 1,000 mg (1,000 mg Oral Given 09/08/24 0607)  ondansetron  (ZOFRAN ) injection 4 mg (4 mg Intravenous Given 09/08/24 0608)  lactated ringers  bolus 1,000 mL (1,000 mLs Intravenous New Bag/Given 09/08/24 0637)  fentaNYL  (SUBLIMAZE ) injection 50 mcg (50 mcg Intravenous Given 09/08/24 0609)     ED COURSE: Urine shows a nitrite positive UTI.  Culture pending.  Will give Rocephin.  Labs show no leukocytosis.  Normal LFTs, lipase.  Normal lactic.  Ultrasound reviewed and interpreted by myself and shows gallstones and possible gallbladder wall thickening.  Official read pending from radiology.  Signed out the oncoming ED physician at 7:50 AM.  CONSULTS: Pending further workup.   OUTSIDE RECORDS REVIEWED: Reviewed last urology note July 2025.       FINAL CLINICAL IMPRESSION(S) / ED DIAGNOSES   Final diagnoses:  Chest pain, unspecified type  Acute UTI  Gallstones  Upper abdominal pain  Nausea and vomiting in adult     Rx / DC Orders   ED Discharge Orders     None        Note:  This document was prepared using Dragon voice recognition software and may include unintentional dictation errors.   Nahom Carfagno, Josette SAILOR, DO 09/08/24 [phone]

## 2024-09-08 NOTE — ED Provider Notes (Signed)
 Reevaluated patient.  Ultrasound not consistent with acute cholecystitis and indeed he has no pain at this time.  Given his description of discomfort suspicious for possible gastritis or biliary colic but also has urinary tract infection.  Discussed with he and his wife at length, offered admission for treatment of UTI, further diagnostics but he opted for discharge with p.o. antibiotics, follow-up with GI and surgery and strict return precautions which is certainly reasonable given that he feels quite well and has no complaints   Arlander Charleston, MD 09/08/24 [phone]

## 2024-09-08 NOTE — ED Triage Notes (Addendum)
 Pt arrives POV, ambulatory to triage, gait steady, no acute distress noted c/p epigastric/chest pain that started yesterday. Pt unable to sleep through out the night. Pt reports n/v, denies sob.

## 2024-09-10 LAB — URINE CULTURE: Culture: >=100000 — AB

## 2024-09-13 LAB — CULTURE, BLOOD (ROUTINE X 2)
Culture: NO GROWTH
Culture: NO GROWTH
Special Requests: ADEQUATE

## 2024-09-19 ENCOUNTER — Ambulatory Visit (INDEPENDENT_AMBULATORY_CARE_PROVIDER_SITE_OTHER): Payer: Self-pay | Admitting: General Surgery

## 2024-09-19 ENCOUNTER — Encounter: Payer: Self-pay | Admitting: General Surgery

## 2024-09-19 VITALS — BP 119/79 | HR 71 | Temp 98.7°F | Ht 68.0 in | Wt 153.8 lb

## 2024-09-19 DIAGNOSIS — K8050 Calculus of bile duct without cholangitis or cholecystitis without obstruction: Secondary | ICD-10-CM | POA: Diagnosis not present

## 2024-09-19 NOTE — Patient Instructions (Signed)
 You have requested to have your gallbladder removed. This will be done at [facility] with Dr. Maurine Minister.  If you are on any injectable weight loss medication, you will need to stop taking your GLP-1 injectable (weight loss) medications 8 days before your surgery to avoid any complications with anesthesia.   You will most likely be out of work 1-2 weeks for this surgery.  If you have FMLA or disability paperwork that needs filled out you may drop this off at our office or this can be faxed to [phone].  You will return after your post-op appointment with a lifting restriction for approximately 4 more weeks.  You will be able to eat anything you would like to following surgery. But, start by eating a bland diet and advance this as tolerated. The Gallbladder diet is below, please go as closely by this diet as possible prior to surgery to avoid any further attacks.  Please see the (blue)pre-care form that you have been given today. Our surgery scheduler will call you to verify surgery date and to go over information.   If you have any questions, please call our office.  Laparoscopic Cholecystectomy Laparoscopic cholecystectomy is surgery to remove the gallbladder. The gallbladder is located in the upper right part of the abdomen, behind the liver. It is a storage sac for bile, which is produced in the liver. Bile aids in the digestion and absorption of fats. Cholecystectomy is often done for inflammation of the gallbladder (cholecystitis). This condition is usually caused by a buildup of gallstones (cholelithiasis) in the gallbladder. Gallstones can block the flow of bile, and that can result in inflammation and pain. In severe cases, emergency surgery may be required. If emergency surgery is not required, you will have time to prepare for the procedure. Laparoscopic surgery is an alternative to open surgery. Laparoscopic surgery has a shorter recovery time. Your common bile duct may also need  to be examined during the procedure. If stones are found in the common bile duct, they may be removed. LET [facility] CARE PROVIDER KNOW ABOUT: Any allergies you have. All medicines you are taking, including vitamins, herbs, eye drops, creams, and over-the-counter medicines. Previous problems you or members of your family have had with the use of anesthetics. Any blood disorders you have. Previous surgeries you have had.  Any medical conditions you have. RISKS AND COMPLICATIONS Generally, this is a safe procedure. However, problems may occur, including: Infection. Bleeding. Allergic reactions to medicines. Damage to other structures or organs. A stone remaining in the common bile duct. A bile leak from the cyst duct that is clipped when your gallbladder is removed. The need to convert to open surgery, which requires a larger incision in the abdomen. This may be necessary if your surgeon thinks that it is not safe to continue with a laparoscopic procedure. BEFORE THE PROCEDURE Ask your health care provider about: Changing or stopping your regular medicines. This is especially important if you are taking diabetes medicines or blood thinners. Taking medicines such as aspirin and ibuprofen. These medicines can thin your blood. Do not take these medicines before your procedure if your health care provider instructs you not to. Follow instructions from your health care provider about eating or drinking restrictions. Let your health care provider know if you develop a cold or an infection before surgery. Plan to have someone take you home after the procedure. Ask your health care provider how your surgical site will be marked or identified. You may  be given antibiotic medicine to help prevent infection. PROCEDURE To reduce your risk of infection: Your health care team will wash or sanitize their hands. Your skin will be washed with soap. An IV tube may be inserted into one of your  veins. You will be given a medicine to make you fall asleep (general anesthetic). A breathing tube will be placed in your mouth. The surgeon will make several small cuts (incisions) in your abdomen. A thin, lighted tube (laparoscope) that has a tiny camera on the end will be inserted through one of the small incisions. The camera on the laparoscope will send a picture to a TV screen (monitor) in the operating room. This will give the surgeon a good view inside your abdomen. A gas will be pumped into your abdomen. This will expand your abdomen to give the surgeon more room to perform the surgery. Other tools that are needed for the procedure will be inserted through the other incisions. The gallbladder will be removed through one of the incisions. After your gallbladder has been removed, the incisions will be closed with stitches (sutures), staples, or skin glue. Your incisions may be covered with a bandage (dressing). The procedure may vary among health care providers and hospitals. AFTER THE PROCEDURE Your blood pressure, heart rate, breathing rate, and blood oxygen level will be monitored often until the medicines you were given have worn off. You will be given medicines as needed to control your pain.   This information is not intended to replace advice given to you by your health care provider. Make sure you discuss any questions you have with your health care provider.   Document Released: 11/16/2005 Document Revised: 08/07/2015 Document Reviewed: 06/28/2013 Elsevier Interactive Patient Education 2016 [employer]   Low-Fat Diet for Gallbladder Conditions A low-fat diet can be helpful if you have pancreatitis or a gallbladder condition. With these conditions, your pancreas and gallbladder have trouble digesting fats. A healthy eating plan with less fat will help rest your pancreas and gallbladder and reduce your symptoms. WHAT DO I NEED TO KNOW ABOUT THIS DIET? Eat a low-fat  diet. Reduce your fat intake to less than 20-30% of your total daily calories. This is less than 50-60 g of fat per day. Remember that you need some fat in your diet. Ask your dietician what your daily goal should be. Choose nonfat and low-fat healthy foods. Look for the words "nonfat," "low fat," or "fat free." As a guide, look on the label and choose foods with less than 3 g of fat per serving. Eat only one serving. Avoid alcohol. Do not smoke. If you need help quitting, talk with your health care provider. Eat small frequent meals instead of three large heavy meals. WHAT FOODS CAN I EAT? Grains Include healthy grains and starches such as potatoes, wheat bread, fiber-rich cereal, and brown rice. Choose whole grain options whenever possible. In adults, whole grains should account for 45-65% of your daily calories.  Fruits and Vegetables Eat plenty of fruits and vegetables. Fresh fruits and vegetables add fiber to your diet. Meats and Other Protein Sources Eat lean meat such as chicken and pork. Trim any fat off of meat before cooking it. Eggs, fish, and beans are other sources of protein. In adults, these foods should account for 10-35% of your daily calories. Dairy Choose low-fat milk and dairy options. Dairy includes fat and protein, as well as calcium.  Fats and Oils Limit high-fat foods such as fried foods, sweets, baked  goods, sugary drinks.  Other Creamy sauces and condiments, such as mayonnaise, can add extra fat. Think about whether or not you need to use them, or use smaller amounts or low fat options. WHAT FOODS ARE NOT RECOMMENDED? High fat foods, such as: [employer]. Ice cream. [place] toast. Sweet rolls. Pizza. Cheese bread. Foods covered with batter, butter, creamy sauces, or cheese. Fried foods. Sugary drinks and desserts. Foods that cause gas or bloating   This information is not intended to replace advice given to you by your health care provider. Make sure you  discuss any questions you have with your health care provider.   Document Released: 11/21/2013 Document Reviewed: 11/21/2013 Elsevier Interactive Patient Education [employer]

## 2024-09-20 NOTE — Progress Notes (Signed)
 Patient ID: Brent Kim, male   DOB: Mar 05, 1941, 83 y.o.   MRN: [id number] CC: Biliary Colic History of Present Illness Brent Kim is a 83 y.o. male with past medical history of GERD and Grovers disease who presents in consultation for biliary colic..  The patient presented to the emergency department approximately a week and a half ago with epigastric and chest pain.  He reports that it felt like he was having a heart attack.  He also reports that he has had some vomiting with this.  He says that along with this came a low-grade temperature of 100.  He had a workup that showed cholelithiasis and sludge.  His pain improved and he was discharged.  He says that since his visit to the emergency department he has had 2 other similar attacks of pain that were not as severe.  Past Medical History Past Medical History:  Diagnosis Date   Erectile dysfunction    GERD (gastroesophageal reflux disease)    Grover's disease    Hyperlipemia    Pneumonia    Urethral stricture        Past Surgical History:  Procedure Laterality Date   CATARACT EXTRACTION W/PHACO Left 09/26/2019   Procedure: CATARACT EXTRACTION PHACO AND INTRAOCULAR LENS PLACEMENT (IOC) LEFT PANOPTIX TORIC;  Surgeon: Jaye Fallow, MD;  Location: [facility];  Service: Ophthalmology;  Laterality: Left;  0:46 16.3% 7.54   CATARACT EXTRACTION W/PHACO Right 11/12/2020   Procedure: CATARACT EXTRACTION PHACO AND INTRAOCULAR LENS PLACEMENT (IOC) RIGHT EYHANCE TORIC 6.52 00:43.9;  Surgeon: Jaye Fallow, MD;  Location: [facility];  Service: Ophthalmology;  Laterality: Right;   COLONOSCOPY WITH PROPOFOL  N/A 07/15/2015   Procedure: COLONOSCOPY WITH PROPOFOL ;  Surgeon: Lamar ONEIDA Holmes, MD;  Location: [facility] ENDOSCOPY;  Service: Endoscopy;  Laterality: N/A;   CYSTOSCOPY WITH DIRECT VISION INTERNAL URETHROTOMY     CYSTOURETHROSCOPY     DESTRUCTION LESION PENILE     VASECTOMY      Allergies  Allergen Reactions    Balsam Other (See Comments)    Other reaction(s): Other (See Comments) Positive patch test Positive patch test    Cetearyl Other (See Comments)    Other reaction(s): Other (See Comments) Positive patch test Positive patch test    Decyl Glucoside Other (See Comments)    Other reaction(s): Other (See Comments) Positive patch test Positive patch test    Linalool Other (See Comments)    Other reaction(s): Other (See Comments) Positive patch test Positive patch test     Current Outpatient Medications  Medication Sig Dispense Refill   cholecalciferol  (VITAMIN D) 1000 UNITS tablet Take 1,000 Units by mouth daily.     loratadine  (CLARITIN ) 10 MG tablet Take 10 mg by mouth daily.     Multiple Vitamin (MULTIVITAMIN) tablet Take 1 tablet by mouth daily.     omeprazole (PRILOSEC OTC) 20 MG tablet Take 2 tablets (40 mg total) by mouth daily for 14 days. 28 tablet 0   omeprazole (PRILOSEC) 20 MG capsule Take 20 mg by mouth daily.     ondansetron  (ZOFRAN -ODT) 4 MG disintegrating tablet Take 1 tablet (4 mg total) by mouth every 8 (eight) hours as needed for nausea or vomiting. 20 tablet 0   sucralfate (CARAFATE) 1 g tablet Take 1 tablet (1 g total) by mouth 4 (four) times daily -  with meals and at bedtime for 15 days. 60 tablet 0   tamsulosin  (FLOMAX ) 0.4 MG CAPS capsule Take 1 capsule (0.4 mg total) by mouth daily  after supper. 90 capsule 4   traMADol (ULTRAM) 50 MG tablet Take 1 tablet (50 mg total) by mouth every 6 (six) hours as needed. 20 tablet 0   traZODone  (DESYREL ) 50 MG tablet Take 50 mg by mouth at bedtime.     No current facility-administered medications for this visit.    Family History Family History  Problem Relation Age of Onset   Kidney cancer Mother    Lung cancer Mother    Stroke Father        Social History Social History   Tobacco Use   Smoking status: Never   Smokeless tobacco: Never  Vaping Use   Vaping status: Never Used  Substance Use Topics    Alcohol use: Yes    Alcohol/week: 14.0 standard drinks of alcohol    Types: 14 Standard drinks or equivalent per week    Comment: 1 burboun, 1 glass of wine        ROS Full ROS of systems performed and is otherwise negative there than what is stated in the HPI  Physical Exam Blood pressure 119/79, pulse 71, temperature 98.7 F (37.1 C), temperature source Oral, height 5' 8 (1.727 m), weight 153 lb 12.8 oz (69.8 kg), SpO2 96%.  Alert and oriented x 3, normal work of breathing on room air, regular rate and rhythm, abdomen soft, nontender nondistended, no surgical scars, moving all extremities spontaneously  Data Reviewed Ultrasound reviewed and significant for cholelithiasis.  His total bilirubin is normal  I have personally reviewed the patient's imaging and medical records.    Assessment    Patient with biliary colic  Plan    I discussed recommendations of robotic assisted cholecystectomy.  We talked about the risk, benefits alternatives of the procedure including risk of infection, bleeding, bile leak, retained stone, need for open procedure.  He understands these risks and wishes to proceed with surgery.  His wife is going to need surgery as well so he is waiting to schedule it once he knows when she will need surgery.    [name]

## 2024-09-21 ENCOUNTER — Ambulatory Visit: Payer: Self-pay | Admitting: General Surgery

## 2024-09-21 ENCOUNTER — Telehealth: Payer: Self-pay | Admitting: General Surgery

## 2024-09-21 DIAGNOSIS — K8050 Calculus of bile duct without cholangitis or cholecystitis without obstruction: Secondary | ICD-10-CM

## 2024-09-21 NOTE — Telephone Encounter (Signed)
 Patient has been advised of Pre-Admission date/time, and Surgery date at [facility].  Surgery Date: 09/25/24 Preadmission Testing Date: 09/22/24 (phone 1p-4p)  Patient informed of the scheduling process and surgery information given at time of office visit.  Patient has been made aware to call [phone], between 1-3:00pm the day before surgery, to find out what time to arrive for surgery.

## 2024-09-22 ENCOUNTER — Other Ambulatory Visit: Payer: Self-pay

## 2024-09-22 ENCOUNTER — Encounter
Admission: RE | Admit: 2024-09-22 | Discharge: 2024-09-22 | Disposition: A | Discharge status: Home / Self Care | Source: Ambulatory Visit | Attending: General Surgery | Admitting: General Surgery

## 2024-09-22 NOTE — Patient Instructions (Addendum)
 Your procedure is scheduled on:  MONDAY OCTOBER 27  Report to the Registration Desk on the 1st floor of the [facility] To find out your arrival time, please call [phone] between 1PM - 3PM on:  FRIDAY OCTOBER 24  If your arrival time is 6:00 am, do not arrive before that time as the [facility] entrance doors do not open until 6:00 am.  REMEMBER: Instructions that are not followed completely may result in serious medical risk, up to and including death; or upon the discretion of your surgeon and anesthesiologist your surgery may need to be rescheduled.  Do not eat food after midnight the night before surgery.  No gum chewing or hard candies.   One week prior to surgery: Stop Anti-inflammatories (NSAIDS) such as Advil, Aleve, Ibuprofen, Motrin, Naproxen, Naprosyn and Aspirin  based products such as Excedrin, Goody's Powder, BC Powder. Stop ANY OVER THE COUNTER supplements until after surgery. cholecalciferol  (VITAMIN D)  loratadine  (CLARITIN )  Multiple Vitamin (MULTIVITAMIN)   You may however, continue to take Tylenol  if needed for pain up until the day of surgery.  Continue taking all of your other prescription medications up until the day of surgery.  ON THE DAY OF SURGERY DO NOT TAKE ANY MEDICATIONS   No Alcohol for 24 hours before or after surgery.  Do not use any recreational drugs for at least a week (preferably 2 weeks) before your surgery.  Please be advised that the combination of cocaine and anesthesia may have negative outcomes, up to and including death. If you test positive for cocaine, your surgery will be cancelled.  On the morning of surgery brush your teeth with toothpaste and water, you may rinse your mouth with mouthwash if you wish. Do not swallow any toothpaste or mouthwash.  Use CHG Soap as directed on instruction sheet.  Do not wear jewelry, make-up, hairpins, clips or nail polish.  For welded (permanent) jewelry: bracelets, anklets, waist bands,  etc.  Please have this removed prior to surgery.  If it is not removed, there is a chance that hospital personnel will need to cut it off on the day of surgery.  Do not wear lotions, powders, or perfumes.   Do not shave body hair from the neck down 48 hours before surgery.  Do not bring valuables to the hospital. [facility] is not responsible for any missing/lost belongings or valuables.   Notify your doctor if there is any change in your medical condition (cold, fever, infection).  Wear comfortable clothing (specific to your surgery type) to the hospital.  After surgery, you can help prevent lung complications by doing breathing exercises.  Take deep breaths and cough every 1-2 hours.   When coughing or sneezing, hold a pillow firmly against your incision with both hands. This is called "splinting." Doing this helps protect your incision. It also decreases belly discomfort.  If you are being discharged the day of surgery, you will not be allowed to drive home. You will need a responsible individual to drive you home and stay with you for 24 hours after surgery.   If you are taking public transportation, you will need to have a responsible individual with you.  Please call the Pre-admissions Testing Dept. at [phone] if you have any questions about these instructions.  Surgery Visitation Policy:  Patients having surgery or a procedure may have two visitors.  Children under the age of 41 must have an adult with them who is not the patient.  [employer]  Directory to address health-related social needs:  [email or web address]                                                                                                             Preparing for Surgery with CHLORHEXIDINE GLUCONATE (CHG) Soap  Chlorhexidine Gluconate (CHG) Soap  o An antiseptic cleaner that kills germs and bonds with the skin to continue killing germs even after washing  o Used for  showering the night before surgery and morning of surgery  Before surgery, you can play an important role by reducing the number of germs on your skin.  CHG (Chlorhexidine gluconate) soap is an antiseptic cleanser which kills germs and bonds with the skin to continue killing germs even after washing.  Please do not use if you have an allergy to CHG or antibacterial soaps. If your skin becomes reddened/irritated stop using the CHG.  1. Shower the NIGHT BEFORE SURGERY with CHG soap.  2. If you choose to wash your hair, wash your hair first as usual with your normal shampoo.  3. After shampooing, rinse your hair and body thoroughly to remove the shampoo.  4. Use CHG as you would any other liquid soap. You can apply CHG directly to the skin and wash gently with a clean washcloth.  5. Apply the CHG soap to your body only from the neck down. Do not use on open wounds or open sores. Avoid contact with your eyes, ears, mouth, and genitals (private parts). Wash face and genitals (private parts) with your normal soap.  6. Wash thoroughly, paying special attention to the area where your surgery will be performed.  7. Thoroughly rinse your body with warm water.  8. Do not shower/wash with your normal soap after using and rinsing off the CHG soap.  9. Do not use lotions, oils, etc., after showering with CHG.  10. Pat yourself dry with a clean towel.  11. Wear clean pajamas to bed the night before surgery.  12. Place clean sheets on your bed the night of your shower and do not sleep with pets.  13. Do not apply any deodorants/lotions/powders.  14. Please wear clean clothes to the hospital.  15. Remember to brush your teeth with your regular toothpaste.

## 2024-09-24 MED ORDER — ORAL CARE MOUTH RINSE: 15.0000 mL | Freq: Once | OROMUCOSAL | Status: AC

## 2024-09-24 MED ORDER — SODIUM CHLORIDE 0.9 % IV SOLN
2.0000 g | INTRAVENOUS | Status: AC
  Administered 2024-09-25: 2 g via INTRAVENOUS

## 2024-09-24 MED ORDER — LACTATED RINGERS IV SOLN: INTRAVENOUS | Status: DC

## 2024-09-24 MED ORDER — CHLORHEXIDINE GLUCONATE CLOTH 2 % EX PADS
6.0000 | MEDICATED_PAD | Freq: Once | CUTANEOUS | Status: AC
  Administered 2024-09-25: 6 via TOPICAL

## 2024-09-24 MED ORDER — INDOCYANINE GREEN 25 MG IV SOLR
1.2500 mg | Freq: Once | INTRAVENOUS | Status: AC
  Administered 2024-09-25: 1.25 mg via INTRAVENOUS

## 2024-09-24 MED ORDER — CHLORHEXIDINE GLUCONATE 0.12 % MT SOLN
15.0000 mL | Freq: Once | OROMUCOSAL | Status: AC
Start: 2024-09-24 — End: 2024-09-25
  Administered 2024-09-25: 15 mL via OROMUCOSAL

## 2024-09-24 MED ORDER — CHLORHEXIDINE GLUCONATE CLOTH 2 % EX PADS: 6.0000 | MEDICATED_PAD | Freq: Once | CUTANEOUS | Status: DC

## 2024-09-25 ENCOUNTER — Telehealth: Payer: Self-pay

## 2024-09-25 ENCOUNTER — Ambulatory Visit: Payer: Self-pay | Admitting: Urgent Care

## 2024-09-25 ENCOUNTER — Encounter
Admission: RE | Disposition: A | Discharge status: Home / Self Care | Payer: Self-pay | Source: Home / Self Care | Attending: General Surgery

## 2024-09-25 ENCOUNTER — Other Ambulatory Visit: Payer: Self-pay

## 2024-09-25 ENCOUNTER — Ambulatory Visit
Admission: RE | Admit: 2024-09-25 | Discharge: 2024-09-25 | Disposition: A | Discharge status: Home / Self Care | Attending: General Surgery | Admitting: General Surgery

## 2024-09-25 ENCOUNTER — Encounter: Payer: Self-pay | Admitting: General Surgery

## 2024-09-25 DIAGNOSIS — K8010 Calculus of gallbladder with chronic cholecystitis without obstruction: Secondary | ICD-10-CM | POA: Diagnosis not present

## 2024-09-25 DIAGNOSIS — Z8546 Personal history of malignant neoplasm of prostate: Secondary | ICD-10-CM | POA: Diagnosis not present

## 2024-09-25 DIAGNOSIS — K219 Gastro-esophageal reflux disease without esophagitis: Secondary | ICD-10-CM | POA: Diagnosis not present

## 2024-09-25 DIAGNOSIS — K8050 Calculus of bile duct without cholangitis or cholecystitis without obstruction: Secondary | ICD-10-CM

## 2024-09-25 SURGERY — CHOLECYSTECTOMY, ROBOT-ASSISTED, LAPAROSCOPIC
Anesthesia: General | Site: Abdomen

## 2024-09-25 MED ORDER — PHENYLEPHRINE 80 MCG/ML (10ML) SYRINGE FOR IV PUSH (FOR BLOOD PRESSURE SUPPORT)
PREFILLED_SYRINGE | INTRAVENOUS | Status: DC | PRN
  Administered 2024-09-25: 80 ug via INTRAVENOUS

## 2024-09-25 MED ORDER — SUGAMMADEX SODIUM 200 MG/2ML IV SOLN
INTRAVENOUS | Status: DC | PRN
  Administered 2024-09-25: 200 mg via INTRAVENOUS

## 2024-09-25 MED ORDER — LIDOCAINE HCL (CARDIAC) PF 100 MG/5ML IV SOSY
PREFILLED_SYRINGE | INTRAVENOUS | Status: DC | PRN
  Administered 2024-09-25: 60 mg via INTRAVENOUS

## 2024-09-25 MED ORDER — HYDROMORPHONE HCL 1 MG/ML IJ SOLN
INTRAMUSCULAR | Status: DC | PRN
  Administered 2024-09-25: .5 mg via INTRAVENOUS

## 2024-09-25 MED ORDER — PHENYLEPHRINE 80 MCG/ML (10ML) SYRINGE FOR IV PUSH (FOR BLOOD PRESSURE SUPPORT)
PREFILLED_SYRINGE | INTRAVENOUS | Status: AC
  Filled 2024-09-25: qty 10

## 2024-09-25 MED ORDER — BUPIVACAINE-EPINEPHRINE (PF) 0.5% -1:200000 IJ SOLN
INTRAMUSCULAR | Status: DC | PRN
  Administered 2024-09-25: 30 mL

## 2024-09-25 MED ORDER — HYDROMORPHONE HCL 1 MG/ML IJ SOLN
INTRAMUSCULAR | Status: AC
  Filled 2024-09-25: qty 1

## 2024-09-25 MED ORDER — ROCURONIUM BROMIDE 100 MG/10ML IV SOLN
INTRAVENOUS | Status: DC | PRN
  Administered 2024-09-25: 50 mg via INTRAVENOUS
  Administered 2024-09-25: 20 mg via INTRAVENOUS

## 2024-09-25 MED ORDER — DEXAMETHASONE SOD PHOSPHATE PF 10 MG/ML IJ SOLN
INTRAMUSCULAR | Status: DC | PRN
  Administered 2024-09-25: 10 mg via INTRAVENOUS

## 2024-09-25 MED ORDER — LACTATED RINGERS IV SOLN: INTRAVENOUS | Status: DC

## 2024-09-25 MED ORDER — SODIUM CHLORIDE 0.9 % IR SOLN
Status: DC | PRN
  Administered 2024-09-25: 1000 mL

## 2024-09-25 MED ORDER — FENTANYL CITRATE (PF) 100 MCG/2ML IJ SOLN
25.0000 ug | INTRAMUSCULAR | Status: DC | PRN
  Administered 2024-09-25 (×2): 25 ug via INTRAVENOUS

## 2024-09-25 MED ORDER — DEXMEDETOMIDINE HCL IN NACL 80 MCG/20ML IV SOLN
INTRAVENOUS | Status: DC | PRN
  Administered 2024-09-25 (×2): 4 ug via INTRAVENOUS

## 2024-09-25 MED ORDER — ONDANSETRON HCL 4 MG/2ML IJ SOLN: 4.0000 mg | Freq: Once | INTRAMUSCULAR | Status: DC | PRN

## 2024-09-25 MED ORDER — ONDANSETRON HCL 4 MG/2ML IJ SOLN
INTRAMUSCULAR | Status: DC | PRN
  Administered 2024-09-25: 4 mg via INTRAVENOUS

## 2024-09-25 MED ORDER — OXYCODONE HCL 5 MG PO TABS
ORAL_TABLET | ORAL | Status: AC
  Filled 2024-09-25: qty 1

## 2024-09-25 MED ORDER — EPHEDRINE 5 MG/ML INJ
INTRAVENOUS | Status: AC
  Filled 2024-09-25: qty 5

## 2024-09-25 MED ORDER — FENTANYL CITRATE (PF) 100 MCG/2ML IJ SOLN
INTRAMUSCULAR | Status: DC | PRN
  Administered 2024-09-25 (×2): 50 ug via INTRAVENOUS

## 2024-09-25 MED ORDER — 0.9 % SODIUM CHLORIDE (POUR BTL) OPTIME
TOPICAL | Status: DC | PRN
  Administered 2024-09-25: 500 mL

## 2024-09-25 MED ORDER — ACETAMINOPHEN 10 MG/ML IV SOLN: 1000.0000 mg | Freq: Once | INTRAVENOUS | Status: DC | PRN

## 2024-09-25 MED ORDER — SODIUM CHLORIDE 0.9 % IV SOLN
INTRAVENOUS | Status: AC
  Filled 2024-09-25: qty 2

## 2024-09-25 MED ORDER — GLYCOPYRROLATE 0.2 MG/ML IJ SOLN
INTRAMUSCULAR | Status: AC
Start: 2024-09-25 — End: 2024-09-25
  Filled 2024-09-25: qty 1

## 2024-09-25 MED ORDER — OXYCODONE HCL 5 MG/5ML PO SOLN: 5.0000 mg | Freq: Once | ORAL | Status: AC | PRN

## 2024-09-25 MED ORDER — PROPOFOL 10 MG/ML IV BOLUS
INTRAVENOUS | Status: AC
  Filled 2024-09-25: qty 20

## 2024-09-25 MED ORDER — FENTANYL CITRATE (PF) 100 MCG/2ML IJ SOLN
INTRAMUSCULAR | Status: AC
  Filled 2024-09-25: qty 2

## 2024-09-25 MED ORDER — CHLORHEXIDINE GLUCONATE 0.12 % MT SOLN
OROMUCOSAL | Status: AC
Start: 2024-09-25 — End: 2024-09-25
  Filled 2024-09-25: qty 15

## 2024-09-25 MED ORDER — BUPIVACAINE-EPINEPHRINE (PF) 0.5% -1:200000 IJ SOLN
INTRAMUSCULAR | Status: AC
  Filled 2024-09-25: qty 30

## 2024-09-25 MED ORDER — OXYCODONE HCL 5 MG PO TABS: 5.0000 mg | ORAL_TABLET | Freq: Four times a day (QID) | ORAL | 0 refills | Status: AC | PRN

## 2024-09-25 MED ORDER — GLYCOPYRROLATE 0.2 MG/ML IJ SOLN
INTRAMUSCULAR | Status: DC | PRN
  Administered 2024-09-25: .2 mg via INTRAVENOUS

## 2024-09-25 MED ORDER — OXYCODONE HCL 5 MG PO TABS
5.0000 mg | ORAL_TABLET | Freq: Once | ORAL | Status: AC | PRN
  Administered 2024-09-25: 5 mg via ORAL

## 2024-09-25 MED ORDER — EPHEDRINE SULFATE-NACL 50-0.9 MG/10ML-% IV SOSY
PREFILLED_SYRINGE | INTRAVENOUS | Status: DC | PRN
  Administered 2024-09-25: 5 mg via INTRAVENOUS

## 2024-09-25 MED ORDER — PROPOFOL 10 MG/ML IV BOLUS
INTRAVENOUS | Status: DC | PRN
  Administered 2024-09-25: 170 mg via INTRAVENOUS

## 2024-09-25 SURGICAL SUPPLY — 39 items
BAG PRESSURE INF REUSE 1000 (BAG) IMPLANT
BLADE SURG 11 STRL SS (BLADE) IMPLANT
CANNULA REDUCER 12-8 DVNC XI (CANNULA) ×1 IMPLANT
CAUTERY HOOK MNPLR 1.6 DVNC XI (INSTRUMENTS) ×1 IMPLANT
CLIP LIGATING HEMO O LOK GREEN (MISCELLANEOUS) ×1 IMPLANT
DEFOGGER SCOPE WARM SEASHARP (MISCELLANEOUS) ×1 IMPLANT
DERMABOND ADVANCED .7 DNX12 (GAUZE/BANDAGES/DRESSINGS) ×1 IMPLANT
DRAPE ARM DVNC X/XI (DISPOSABLE) ×4 IMPLANT
DRAPE COLUMN DVNC XI (DISPOSABLE) ×1 IMPLANT
ELECTRODE REM PT RTRN 9FT ADLT (ELECTROSURGICAL) ×1 IMPLANT
FORCEPS BPLR 8 MD DVNC XI (FORCEP) IMPLANT
FORCEPS BPLR R/ABLATION 8 DVNC (INSTRUMENTS) ×1 IMPLANT
FORCEPS PROGRASP DVNC XI (FORCEP) ×1 IMPLANT
GLOVE BIOGEL PI IND STRL 7.5 (GLOVE) ×2 IMPLANT
GLOVE SURG SYN 7.0 PF PI (GLOVE) ×2 IMPLANT
GOWN STRL REUS W/ TWL LRG LVL3 (GOWN DISPOSABLE) ×3 IMPLANT
GRASPER SUT TROCAR 14GX15 (MISCELLANEOUS) ×1 IMPLANT
IRRIGATION STRYKERFLOW (MISCELLANEOUS) IMPLANT
IRRIGATOR SUCT 8 DISP DVNC XI (IRRIGATION / IRRIGATOR) IMPLANT
IV 0.9% NACL 1000 ML (IV SOLUTION) IMPLANT
KIT PINK PAD W/HEAD ARM REST (MISCELLANEOUS) ×1 IMPLANT
LABEL OR SOLS (LABEL) ×1 IMPLANT
MANIFOLD NEPTUNE II (INSTRUMENTS) ×1 IMPLANT
NDL HYPO 22X1.5 SAFETY MO (MISCELLANEOUS) ×1 IMPLANT
NDL INSUFFLATION 14GA 120MM (NEEDLE) ×1 IMPLANT
NEEDLE HYPO 22X1.5 SAFETY MO (MISCELLANEOUS) ×1 IMPLANT
NEEDLE INSUFFLATION 14GA 120MM (NEEDLE) ×1 IMPLANT
NS IRRIG 500ML POUR BTL (IV SOLUTION) ×1 IMPLANT
OBTURATOR OPTICALSTD 8 DVNC (TROCAR) ×1 IMPLANT
PACK LAP CHOLECYSTECTOMY (MISCELLANEOUS) ×1 IMPLANT
SEAL UNIV 5-12 XI (MISCELLANEOUS) ×4 IMPLANT
SET TUBE SMOKE EVAC HIGH FLOW (TUBING) ×1 IMPLANT
SOLUTION ELECTROSURG ANTI STCK (MISCELLANEOUS) ×1 IMPLANT
SPIKE FLUID TRANSFER (MISCELLANEOUS) ×2 IMPLANT
SPONGE T-LAP 4X18 ~~LOC~~+RFID (SPONGE) IMPLANT
SUT VICRYL 0 UR6 27IN ABS (SUTURE) ×1 IMPLANT
SUTURE MNCRL 4-0 27XMF (SUTURE) ×1 IMPLANT
SYSTEM BAG RETRIEVAL 10MM (BASKET) ×1 IMPLANT
WATER STERILE IRR 500ML POUR (IV SOLUTION) ×1 IMPLANT

## 2024-09-25 NOTE — Op Note (Signed)
 Robotic assisted laparoscopic Cholecystectomy  Pre-operative Diagnosis: Biliary Colic  Post-operative Diagnosis: Same  Procedure:  Robotic assisted laparoscopic Cholecystectomy  Surgeon: Jayson Endow, MD  Anesthesia: Gen. with endotracheal tube  Findings: Mildly adhesed gallbladder to omentum, critical view of safety obtained  Estimated Blood Loss: 15cc       Specimens: Gallbladder           Complications: none   Procedure Details  The patient was seen again in the Holding Room. The benefits, complications, treatment options, and expected outcomes were discussed with the patient. The risks of bleeding, infection, recurrence of symptoms, failure to resolve symptoms, bile duct damage, bile duct leak, retained common bile duct stone, bowel injury, any of which could require further surgery and/or ERCP, stent, or papillotomy were reviewed with the patient. The likelihood of improving the patient's symptoms with return to their baseline status is good.  The patient and/or family concurred with the proposed plan, giving informed consent.  The patient was taken to Operating Room, identified  and the procedure verified as robotic Cholecystectomy.  A Time Out was held and the above information confirmed.  Prior to the induction of general anesthesia, antibiotic prophylaxis was administered. VTE prophylaxis was in place. General endotracheal anesthesia was then administered and tolerated well. After the induction, the abdomen was prepped with Chloraprep and draped in the sterile fashion. The patient was positioned in the supine position.  A veress needle was inserted into the abdomen using standard drop technique. An 8mm infra-umbilical robotic port was then placed under direct visualization. There was no injury noted at the site of veress needle insertion. Two right sided abdominal 8mm ports followed by an 8mm left abdominal robotic ports were placed under direct visualization. The left sided  abdominal port was then upsized to a 12mm robotic port.  The patient was positioned  in reverse Trendelenburg, robot was brought to the surgical field and docked in the standard fashion.  We made sure all the instrumentation was kept indirect view at all times and that there were no collision between the arms. I scrubbed out and went to the console.  The gallbladder was identified, the fundus grasped and retracted cephalad. Adhesions were lysed bluntly. The infundibulum was grasped and retracted laterally, exposing the peritoneum overlying the triangle of Calot. This was then divided and exposed in a blunt fashion. An extended critical view of the cystic duct and cystic artery was obtained.  The cystic duct was clearly identified and bluntly dissected.   Artery and duct were double clipped and divided. Using ICG cholangiography we visualized the cystic duct. The gallbladder was taken from the gallbladder fossa in a retrograde fashion with the electrocautery.  Hemostasis was achieved with the electrocautery. nspection of the right upper quadrant was performed. No bleeding, bile duct injury or leak, or bowel injury was noted. Robotic instruments and robotic arms were undocked in the standard fashion.  I scrubbed back in.  The gallbladder was removed and placed in an Endocatch bag.   The left lower quadrant fascia was then closed with a 0 vicryl using a suture needle passer. The pre-peritoneal space was then infiltrated with liposomal bupivicaine and marcaine solution. Pneumoperitoneum was released.  4-0 subcuticular Monocryl was used to close the skin. Dermabond was  applied.  The patient was then extubated and brought to the recovery room in stable condition. Sponge, lap, and needle counts were correct at closure and at the conclusion of the case.  Jayson Endow, M.D. [facility]

## 2024-09-25 NOTE — Telephone Encounter (Signed)
 Pt had cholecystectomy 09/25/2024. Wife was concerned that husband had vomited once when he got home from hospital. I advised her that it could be from the anesthesia. She reported that he was resting now and will call back if the vomiting persist.

## 2024-09-25 NOTE — Anesthesia Preprocedure Evaluation (Addendum)
 Anesthesia Evaluation  Patient identified by MRN, date of birth, ID band Patient awake    Reviewed: Allergy & Precautions, NPO status , Patient's Chart, lab work & pertinent test results  History of Anesthesia Complications Negative for: history of anesthetic complications  Airway Mallampati: I   Neck ROM: Full    Dental no notable dental hx.    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; LAFB  Myocardial perfusion 09/23/22:   Normal myocardial perfusion scan no evidence of stress-induced  myocardial ischemia ejection fraction of 51% conclusion negative scan   Echo 09/23/22:  NORMAL LEFT VENTRICULAR SYSTOLIC FUNCTION   WITH MILD LVH  NORMAL RIGHT VENTRICULAR SYSTOLIC FUNCTION  MILD VALVULAR REGURGITATION   NO VALVULAR STENOSIS  MILD MR, TR, PR  EF 50%     Neuro/Psych negative neurological ROS     GI/Hepatic ,GERD  ,,  Endo/Other  negative endocrine ROS    Renal/GU negative Renal ROS   Prostate CA    Musculoskeletal   Abdominal   Peds  Hematology negative hematology ROS (+)   Anesthesia Other Findings   Reproductive/Obstetrics                              Anesthesia Physical Anesthesia Plan  ASA: 2  Anesthesia Plan: General   Post-op Pain Management:    Induction: Intravenous  PONV Risk Score and Plan: 2 and Ondansetron , Dexamethasone and Treatment may vary due to age or medical condition  Airway Management Planned: Oral ETT  Additional Equipment:   Intra-op Plan:   Post-operative Plan: Extubation in OR  Informed Consent: I have reviewed the patients History and Physical, chart, labs and discussed the procedure including the risks, benefits and alternatives for the proposed  anesthesia with the patient or authorized representative who has indicated his/her understanding and acceptance.     Dental advisory given  Plan Discussed with: CRNA  Anesthesia Plan Comments: (Patient consented for risks of anesthesia including but not limited to:  - adverse reactions to medications - damage to eyes, teeth, lips or other oral mucosa - nerve damage due to positioning  - sore throat or hoarseness - damage to heart, brain, nerves, lungs, other parts of body or loss of life  Informed patient about role of CRNA in peri- and intra-operative care.  Patient voiced understanding.)         Anesthesia Quick Evaluation

## 2024-09-25 NOTE — H&P (Signed)
 No changes to below H and P, proceed with robo assisted chole with ICG  CC: Biliary Colic History of Present Illness Brent Kim is a 83 y.o. male with past medical history of GERD and Grovers disease who presents in consultation for biliary colic..  The patient presented to the emergency department approximately a week and a half ago with epigastric and chest pain.  He reports that it felt like he was having a heart attack.  He also reports that he has had some vomiting with this.  He says that along with this came a low-grade temperature of 100.  He had a workup that showed cholelithiasis and sludge.  His pain improved and he was discharged.  He says that since his visit to the emergency department he has had 2 other similar attacks of pain that were not as severe.   Past Medical History     Past Medical History:  Diagnosis Date   Erectile dysfunction     GERD (gastroesophageal reflux disease)     Grover's disease     Hyperlipemia     Pneumonia     Urethral stricture                   Past Surgical History:  Procedure Laterality Date   CATARACT EXTRACTION W/PHACO Left 09/26/2019    Procedure: CATARACT EXTRACTION PHACO AND INTRAOCULAR LENS PLACEMENT (IOC) LEFT PANOPTIX TORIC;  Surgeon: Brent Fallow, MD;  Location: [facility];  Service: Ophthalmology;  Laterality: Left;  0:46 16.3% 7.54   CATARACT EXTRACTION W/PHACO Right 11/12/2020    Procedure: CATARACT EXTRACTION PHACO AND INTRAOCULAR LENS PLACEMENT (IOC) RIGHT EYHANCE TORIC 6.52 00:43.9;  Surgeon: Brent Fallow, MD;  Location: [facility];  Service: Ophthalmology;  Laterality: Right;   COLONOSCOPY WITH PROPOFOL  N/A 07/15/2015    Procedure: COLONOSCOPY WITH PROPOFOL ;  Surgeon: Brent ONEIDA Holmes, MD;  Location: [facility]COPY;  Service: Endoscopy;  Laterality: N/A;   CYSTOSCOPY WITH DIRECT VISION INTERNAL URETHROTOMY       CYSTOURETHROSCOPY       DESTRUCTION LESION PENILE       VASECTOMY               Allergies       Allergies  Allergen Reactions   Balsam Other (See Comments)      Other reaction(s): Other (See Comments) Positive patch test Positive patch test     Cetearyl Other (See Comments)      Other reaction(s): Other (See Comments) Positive patch test Positive patch test     Decyl Glucoside Other (See Comments)      Other reaction(s): Other (See Comments) Positive patch test Positive patch test     Linalool Other (See Comments)      Other reaction(s): Other (See Comments) Positive patch test Positive patch test                Current Outpatient Medications  Medication Sig Dispense Refill   cholecalciferol  (VITAMIN D) 1000 UNITS tablet Take 1,000 Units by mouth daily.       loratadine  (CLARITIN ) 10 MG tablet Take 10 mg by mouth daily.       Multiple Vitamin (MULTIVITAMIN) tablet Take 1 tablet by mouth daily.       omeprazole (PRILOSEC OTC) 20 MG tablet Take 2 tablets (40 mg total) by mouth daily for 14 days. 28 tablet 0   omeprazole (PRILOSEC) 20 MG capsule Take 20 mg by mouth daily.       ondansetron  (  ZOFRAN -ODT) 4 MG disintegrating tablet Take 1 tablet (4 mg total) by mouth every 8 (eight) hours as needed for nausea or vomiting. 20 tablet 0   sucralfate (CARAFATE) 1 g tablet Take 1 tablet (1 g total) by mouth 4 (four) times daily -  with meals and at bedtime for 15 days. 60 tablet 0   tamsulosin  (FLOMAX ) 0.4 MG CAPS capsule Take 1 capsule (0.4 mg total) by mouth daily after supper. 90 capsule 4   traMADol (ULTRAM) 50 MG tablet Take 1 tablet (50 mg total) by mouth every 6 (six) hours as needed. 20 tablet 0   traZODone  (DESYREL ) 50 MG tablet Take 50 mg by mouth at bedtime.          No current facility-administered medications for this visit.        Family History      Family History  Problem Relation Age of Onset   Kidney cancer Mother     Lung cancer Mother     Stroke Father              Social History Social History  Social History          Tobacco Use   Smoking status: Never   Smokeless tobacco: Never  Vaping Use   Vaping status: Never Used  Substance Use Topics   Alcohol use: Yes      Alcohol/week: 14.0 standard drinks of alcohol      Types: 14 Standard drinks or equivalent per week      Comment: 1 burboun, 1 glass of wine            ROS Full ROS of systems performed and is otherwise negative there than what is stated in the HPI   Physical Exam Blood pressure 119/79, pulse 71, temperature 98.7 F (37.1 C), temperature source Oral, height 5' 8 (1.727 m), weight 153 lb 12.8 oz (69.8 kg), SpO2 96%.   Alert and oriented x 3, normal work of breathing on room air, regular rate and rhythm, abdomen soft, nontender nondistended, no surgical scars, moving all extremities spontaneously   Data Reviewed Ultrasound reviewed and significant for cholelithiasis.  His total bilirubin is normal   I have personally reviewed the patient's imaging and medical records.     Assessment Assessment Patient with biliary colic   Plan Plan I discussed recommendations of robotic assisted cholecystectomy.  We talked about the risk, benefits alternatives of the procedure including risk of infection, bleeding, bile leak, retained stone, need for open procedure.  He understands these risks and wishes to proceed with surgery.  His wife is going to need surgery as well so he is waiting to schedule it once he knows when she will need surgery.       Brent Kim

## 2024-09-25 NOTE — Anesthesia Procedure Notes (Signed)
 Procedure Name: Intubation Date/Time: 09/25/2024 9:34 AM  Performed by: Niki Manus SAUNDERS, CRNAPre-anesthesia Checklist: Patient identified, Patient being monitored, Timeout performed, Emergency Drugs available and Suction available Patient Re-evaluated:Patient Re-evaluated prior to induction Oxygen Delivery Method: Circle system utilized Preoxygenation: Pre-oxygenation with 100% oxygen Induction Type: IV induction Ventilation: Mask ventilation without difficulty and Oral airway inserted - appropriate to patient size Laryngoscope Size: McGrath and 4 Grade View: Grade I Tube type: Oral Tube size: 7.5 mm Number of attempts: 1 Airway Equipment and Method: Stylet Placement Confirmation: ETT inserted through vocal cords under direct vision, positive ETCO2 and breath sounds checked- equal and bilateral Secured at: 21 cm Tube secured with: Tape Dental Injury: Teeth and Oropharynx as per pre-operative assessment  Comments: Atraumatic

## 2024-09-25 NOTE — Transfer of Care (Signed)
 Immediate Anesthesia Transfer of Care Note  Patient: Brent Kim  Procedure(s) Performed: CHOLECYSTECTOMY, ROBOT-ASSISTED, LAPAROSCOPIC (Abdomen)  Patient Location: PACU  Anesthesia Type:General  Level of Consciousness: awake, alert , and oriented  Airway & Oxygen Therapy: Patient Spontanous Breathing and Patient connected to face mask  Post-op Assessment: Report given to RN, Post -op Vital signs reviewed and stable, and Patient moving all extremities X 4  Post vital signs: Reviewed  Last Vitals:  Vitals Value Taken Time  BP 170/86 09/25/24 11:02  Temp    Pulse 65 09/25/24 11:08  Resp 17 09/25/24 11:08  SpO2 100 % 09/25/24 11:08  Vitals shown include unfiled device data.  Last Pain:  Vitals:   09/25/24 0810  TempSrc: (P) Tympanic  PainSc: (P) 0-No pain         Complications: No notable events documented.

## 2024-09-26 LAB — SURGICAL PATHOLOGY

## 2024-09-26 NOTE — Anesthesia Postprocedure Evaluation (Signed)
 Anesthesia Post Note  Patient: Brent Kim  Procedure(s) Performed: CHOLECYSTECTOMY, ROBOT-ASSISTED, LAPAROSCOPIC (Abdomen)  Patient location during evaluation: PACU Anesthesia Type: General Level of consciousness: awake and alert, oriented and patient cooperative Pain management: pain level controlled Vital Signs Assessment: post-procedure vital signs reviewed and stable Respiratory status: spontaneous breathing, nonlabored ventilation and respiratory function stable Cardiovascular status: blood pressure returned to baseline and stable Postop Assessment: adequate PO intake Anesthetic complications: no   No notable events documented.   Last Vitals:  Vitals:   09/25/24 1145 09/25/24 1159  BP: (!) 147/79 (!) 146/86  Pulse: 68 (!) 54  Resp: 18 18  Temp: (!) 36.1 C (!) 36.2 C  SpO2: 95% 97%    Last Pain:  Vitals:   09/25/24 1159  TempSrc: Temporal  PainSc: 4                  [place]

## 2024-09-27 ENCOUNTER — Other Ambulatory Visit

## 2024-09-30 ENCOUNTER — Emergency Department

## 2024-09-30 ENCOUNTER — Emergency Department
Admission: EM | Admit: 2024-09-30 | Discharge: 2024-09-30 | Disposition: A | Discharge status: Home / Self Care | Attending: Emergency Medicine | Admitting: Emergency Medicine

## 2024-09-30 ENCOUNTER — Other Ambulatory Visit: Payer: Self-pay

## 2024-09-30 DIAGNOSIS — R1013 Epigastric pain: Secondary | ICD-10-CM

## 2024-09-30 DIAGNOSIS — I10 Essential (primary) hypertension: Secondary | ICD-10-CM | POA: Insufficient documentation

## 2024-09-30 DIAGNOSIS — I7121 Aneurysm of the ascending aorta, without rupture: Secondary | ICD-10-CM | POA: Diagnosis not present

## 2024-09-30 LAB — HEPATIC FUNCTION PANEL
ALT: 70 U/L — ABNORMAL HIGH (ref 0–44)
AST: 129 U/L — ABNORMAL HIGH (ref 15–41)
Albumin: 3.3 g/dL — ABNORMAL LOW (ref 3.5–5.0)
Alkaline Phosphatase: 120 U/L (ref 38–126)
Bilirubin, Direct: 0.2 mg/dL (ref 0.0–0.2)
Indirect Bilirubin: 0.6 mg/dL (ref 0.3–0.9)
Total Bilirubin: 0.8 mg/dL (ref 0.0–1.2)
Total Protein: 7 g/dL (ref 6.5–8.1)

## 2024-09-30 LAB — TROPONIN I (HIGH SENSITIVITY)
Troponin I (High Sensitivity): 6 ng/L (ref <18)
Troponin I (High Sensitivity): 6 ng/L (ref <18)

## 2024-09-30 LAB — URINALYSIS, ROUTINE W REFLEX MICROSCOPIC
Bilirubin Urine: NEGATIVE
Glucose, UA: NEGATIVE mg/dL
Hgb urine dipstick: NEGATIVE
Ketones, ur: NEGATIVE mg/dL
Leukocytes,Ua: NEGATIVE
Nitrite: NEGATIVE
Protein, ur: NEGATIVE mg/dL
Specific Gravity, Urine: 1.019 (ref 1.005–1.030)
pH: 6 (ref 5.0–8.0)

## 2024-09-30 LAB — CBC
HCT: 37.1 % — ABNORMAL LOW (ref 39.0–52.0)
Hemoglobin: 12.1 g/dL — ABNORMAL LOW (ref 13.0–17.0)
MCH: 34.3 pg — ABNORMAL HIGH (ref 26.0–34.0)
MCHC: 32.6 g/dL (ref 30.0–36.0)
MCV: 105.1 fL — ABNORMAL HIGH (ref 80.0–100.0)
Platelets: 235 K/uL (ref 150–400)
RBC: 3.53 MIL/uL — ABNORMAL LOW (ref 4.22–5.81)
RDW: 12.5 % (ref 11.5–15.5)
WBC: 4 K/uL (ref 4.0–10.5)
nRBC: 0 % (ref 0.0–0.2)

## 2024-09-30 LAB — BASIC METABOLIC PANEL WITH GFR
Anion gap: 11 (ref 5–15)
BUN: 19 mg/dL (ref 8–23)
CO2: 27 mmol/L (ref 22–32)
Calcium: 9.2 mg/dL (ref 8.9–10.3)
Chloride: 101 mmol/L (ref 98–111)
Creatinine, Ser: 0.93 mg/dL (ref 0.61–1.24)
GFR, Estimated: >60 mL/min (ref >60)
Glucose, Bld: 118 mg/dL — ABNORMAL HIGH (ref 70–99)
Potassium: 4.7 mmol/L (ref 3.5–5.1)
Sodium: 139 mmol/L (ref 135–145)

## 2024-09-30 LAB — LIPASE, BLOOD: Lipase: 37 U/L (ref 11–51)

## 2024-09-30 MED ORDER — IOHEXOL 350 MG/ML SOLN
100.0000 mL | Freq: Once | INTRAVENOUS | Status: AC | PRN
Start: 2024-09-30 — End: 2024-09-30
  Administered 2024-09-30: 100 mL via INTRAVENOUS

## 2024-09-30 NOTE — ED Notes (Signed)
 US  has completed

## 2024-09-30 NOTE — ED Provider Notes (Signed)
 [facility] Provider Note    Event Date/Time   First MD Initiated Contact with Patient 09/30/24 1206     (approximate)   History   Chest Pain and Emesis   HPI  Brent Kim is a 83 year old male with history of hypertension, hyperlipidemia presenting to the emergency department for evaluation of epigastric pain.  Patient had cholecystectomy performed on Monday.  Has had some mild generalized tenderness, but this morning had worsened pain similar to when he initially presented to the ER with pain related to his gallbladder.  Symptoms improved, but had a second episode leading him to present to the ER.  Did have 1 episode of vomiting.  Currently reports complete resolution of his symptoms.     Physical Exam   Triage Vital Signs: ED Triage Vitals [09/30/24 1128]  Encounter Vitals Group     BP (!) 147/84     Girls Systolic BP Percentile      Girls Diastolic BP Percentile      Boys Systolic BP Percentile      Boys Diastolic BP Percentile      Pulse Rate 73     Resp 18     Temp 98 F (36.7 C)     Temp Source Oral     SpO2 98 %     Weight      Height      Head Circumference      Peak Flow      Pain Score 4     Pain Loc      Pain Education      Exclude from Growth Chart     Most recent vital signs: Vitals:   09/30/24 1128 09/30/24 1300  BP: (!) 147/84 (!) 152/79  Pulse: 73 (!) 55  Resp: 18 20  Temp: 98 F (36.7 C)   SpO2: 98% 98%     General: Awake, interactive  CV:  Good peripheral perfusion Resp:  Unlabored respirations Abd:  Nondistended, soft, mild generalized tenderness over the lower abdomen in the location of prior surgical sites, ecchymosis around periumbilical region Neuro:  Symmetric facial movement, fluid speech   ED Results / Procedures / Treatments   Labs (all labs ordered are listed, but only abnormal results are displayed) Labs Reviewed  BASIC METABOLIC PANEL WITH GFR - Abnormal; Notable for the following  components:      Result Value   Glucose, Bld 118 (*)    All other components within normal limits  CBC - Abnormal; Notable for the following components:   RBC 3.53 (*)    Hemoglobin 12.1 (*)    HCT 37.1 (*)    MCV 105.1 (*)    MCH 34.3 (*)    All other components within normal limits  URINALYSIS, ROUTINE W REFLEX MICROSCOPIC - Abnormal; Notable for the following components:   Color, Urine YELLOW (*)    APPearance CLEAR (*)    All other components within normal limits  HEPATIC FUNCTION PANEL - Abnormal; Notable for the following components:   Albumin 3.3 (*)    AST 129 (*)    ALT 70 (*)    All other components within normal limits  LIPASE, BLOOD  TROPONIN I (HIGH SENSITIVITY)  TROPONIN I (HIGH SENSITIVITY)     EKG EKG independently reviewed and interpreted by myself demonstrates:  EKG demonstrate sinus rhythm at a rate of 77, PR 156, QRS 108, QTc 450, no acute ST changes  RADIOLOGY Imaging independently reviewed and interpreted by myself  demonstrates:  CT of the chest without evidence of PE, radiology does note thoracic aneurysm CT abdomen pelvis with postoperative findings without acute abnormality  Formal Radiology Read:  CT Angio Chest PE W and/or Wo Contrast Result Date: 09/30/2024 CLINICAL DATA:  Epigastric and chest pain, recent cholecystectomy, diaphoresis, chills, emesis EXAM: CT ANGIOGRAPHY CHEST WITH CONTRAST TECHNIQUE: Multidetector CT imaging of the chest was performed using the standard protocol during bolus administration of intravenous contrast. Multiplanar CT image reconstructions and MIPs were obtained to evaluate the vascular anatomy. RADIATION DOSE REDUCTION: This exam was performed according to the departmental dose-optimization program which includes automated exposure control, adjustment of the mA and/or kV according to patient size and/or use of iterative reconstruction technique. CONTRAST:  OMNIPAQUE  IOHEXOL  350 MG/ML SOLN COMPARISON:  09/30/2024  FINDINGS: Cardiovascular: This is a technically adequate evaluation of the pulmonary vasculature. No filling defects or pulmonary emboli. Mild cardiomegaly without pericardial effusion. Moderate coronary artery atherosclerosis. 4 cm ascending thoracic aortic aneurysm. Mild atherosclerosis of the thoracic aorta. Assessment of the aortic lumen is limited due to timing of contrast bolus. Mediastinum/Nodes: No enlarged mediastinal, hilar, or axillary lymph nodes. Thyroid gland, trachea, and esophagus demonstrate no significant findings. Lungs/Pleura: No acute airspace disease, effusion, or pneumothorax. Central airways are patent. Upper Abdomen: Mild fat stranding and trace free fluid within the gallbladder fossa, compatible with given history of recent cholecystectomy. No evidence of fluid collection or abscess. Musculoskeletal: No acute or destructive bony abnormalities. Reconstructed images demonstrate no additional findings. Review of the MIP images confirms the above findings. IMPRESSION: 1. No evidence of pulmonary embolus. 2. No acute intrathoracic process. 3. Expected postsurgical changes in the gallbladder fossa after recent cholecystectomy. No fluid collection or abscess identified. 4. 4 cm ascending thoracic aortic aneurysm. Recommend annual imaging followup by CTA or MRA. This recommendation follows 2010 ACCF/AHA/AATS/ACR/ASA/SCA/SCAI/SIR/STS/SVM Guidelines for the Diagnosis and Management of Patients with Thoracic Aortic Disease. Circulation. 2010; 121: Z733-z630. Aortic aneurysm NOS (ICD10-I71.9) 5. Mild cardiomegaly. 6. Aortic Atherosclerosis (ICD10-I70.0). Coronary artery atherosclerosis. Electronically Signed   By: Ozell Daring M.D.   On: 09/30/2024 13:54   CT ABDOMEN PELVIS W CONTRAST Result Date: 09/30/2024 EXAM: CT ABDOMEN AND PELVIS WITH CONTRAST 09/30/2024 01:35:13 PM TECHNIQUE: CT of the abdomen and pelvis was performed with the administration of 100 mL of iohexol  (OMNIPAQUE ) 350 MG/ML  injection. Multiplanar reformatted images are provided for review. Automated exposure control, iterative reconstruction, and/or weight-based adjustment of the mA/kV was utilized to reduce the radiation dose to as low as reasonably achievable. COMPARISON: 06/18/2005 CLINICAL HISTORY: RUQ and epigastric pain after recent cholecystectomy. FINDINGS: LOWER CHEST: No acute abnormality. LIVER: The liver is unremarkable. GALLBLADDER AND BILE DUCTS: Status post cholecystectomy. Mild extrahepatic biliary dilatation is noted, most consistent with post cholecystectomy status. Small amount of fluid is noted in the gallbladder fossa, most consistent with postoperative status. SPLEEN: No acute abnormality. PANCREAS: No acute abnormality. ADRENAL GLANDS: No acute abnormality. KIDNEYS, URETERS AND BLADDER: No stones in the kidneys or ureters. No hydronephrosis. No perinephric or periureteral stranding. Urinary bladder is unremarkable. GI AND BOWEL: Stomach demonstrates no acute abnormality. Sigmoid diverticulosis without inflammation. There is no bowel obstruction. PERITONEUM AND RETROPERITONEUM: No ascites. No free air. VASCULATURE: Aorta is normal in caliber. Aortic atherosclerosis. LYMPH NODES: No lymphadenopathy. REPRODUCTIVE ORGANS: No acute abnormality. BONES AND SOFT TISSUES: No acute osseous abnormality. No focal soft tissue abnormality. IMPRESSION: 1. No acute findings. 2. Status post cholecystectomy with mild extrahepatic biliary dilatation and a small amount of fluid in  the gallbladder fossa, compatible with postoperative changes. 3. Sigmoid diverticulosis without evidence of inflammation. 4. Aortic atherosclerosis. Electronically signed by: Lynwood Seip MD 09/30/2024 01:53 PM EDT RP Workstation: [id number]   DG Chest 2 View Result Date: 09/30/2024 CLINICAL DATA:  Epigastric/chest pain.  Recent cholecystectomy. EXAM: CHEST - 2 VIEW COMPARISON:  09/08/2024 FINDINGS: Lungs are adequately inflated and otherwise clear.  Cardiomediastinal silhouette and remainder of the exam is unchanged. IMPRESSION: No active cardiopulmonary disease. Electronically Signed   By: Toribio Agreste M.D.   On: 09/30/2024 11:50    PROCEDURES:  Critical Care performed: No  Procedures   MEDICATIONS ORDERED IN ED: Medications  iohexol  (OMNIPAQUE ) 350 MG/ML injection 100 mL (100 mLs Intravenous Contrast Given 09/30/24 1323)     IMPRESSION / MDM / ASSESSMENT AND PLAN / ED COURSE  I reviewed the triage vital signs and the nursing notes.  Differential diagnosis includes, but is not limited to, postoperative complication, gastritis, pancreatitis, PE, pneumonia, pneumothorax  Patient's presentation is most consistent with acute presentation with potential threat to life or bodily function.  83 year old male presenting with epigastric pain after recent cholecystectomy.  Stable vitals on presentation.  Labs with mild anemia, BMP without significant derangement.  LFTs with mild transaminitis, likely reactive in the setting of recent surgery, reassuring T. bili at 0.8.  UA without evidence of infection.  Normal lipase.  Negative troponin.  EKG without acute ischemic findings.  CT of the chest without PE, incidental aneurysm noted which patient was updated on.  CT abdomen pelvis with expected postoperative findings.  Case reviewed with Dr. Marinda with general surgery who evaluated the patient at bedside.  He is reassured by patient's exam and imaging.  Does think patient stable for discharge home.  Patient comfortable to plan.  Strict return precautions provided.  Patient discharged in stable condition.     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   Levander Slate,  MD 09/30/24 [phone]

## 2024-09-30 NOTE — Discharge Instructions (Signed)
 You were seen in the ER today for evaluation of your abdominal pain.  Your testing fortunately did not show an emergency cause for this.  As we discussed your CT did show that you have an area of enlargement and a large vessel noticed in aorta.  Please follow-up with the CT surgeon for further evaluation of this.  Keep your scheduled follow-up with your general surgeon.  Return to the ER for new or worsening symptoms.

## 2024-09-30 NOTE — ED Triage Notes (Signed)
 Pt to ED via POV from home. Pt ambulatory to triage. Pt reports on 10/10 had epigastric pain/CP and went for gallbladder surgery that following Monday. Pt reports today had a sudden onset of severe pain in central chest/epigastric region, diaphoretic, chills and episode of emesis.

## 2024-10-04 ENCOUNTER — Ambulatory Visit: Admitting: Radiation Oncology

## 2024-10-04 ENCOUNTER — Inpatient Hospital Stay: Attending: Radiation Oncology

## 2024-10-04 ENCOUNTER — Encounter: Payer: Self-pay | Admitting: Physician Assistant

## 2024-10-04 ENCOUNTER — Ambulatory Visit: Admitting: Physician Assistant

## 2024-10-04 VITALS — BP 133/85 | HR 64 | Ht 68.0 in | Wt 151.0 lb

## 2024-10-04 DIAGNOSIS — Z09 Encounter for follow-up examination after completed treatment for conditions other than malignant neoplasm: Secondary | ICD-10-CM

## 2024-10-04 DIAGNOSIS — C61 Malignant neoplasm of prostate: Secondary | ICD-10-CM | POA: Diagnosis present

## 2024-10-04 DIAGNOSIS — K8050 Calculus of bile duct without cholangitis or cholecystitis without obstruction: Secondary | ICD-10-CM

## 2024-10-04 LAB — PSA: Prostatic Specific Antigen: 0.07 ng/mL (ref 0.00–4.00)

## 2024-10-04 NOTE — Patient Instructions (Signed)

## 2024-10-04 NOTE — Progress Notes (Unsigned)
 [facility] POST-OP OFFICE VISIT  10/04/2024  HPI: Brent Kim is a 83 y.o. male 9 days s/p robotic assisted laparoscopic cholecystectomy for biliary colic with Dr Marinda  He did have a presentation to the ED on 11/01 secondary to chest pain and emesis. Work up reviewed including CT Abdomen/pelvis which showed expected, non-specific fluid in gallbladder fossa. His symptoms resolved and he was DC home.   This afternoon, he reports he is doing much better. No significant abdominal pain. No fever, chills, nausea, emesis. He is tolerating PO without diarrhea. Incisions are healing well. No other complaints   Vital signs: BP 133/85   Pulse 64   Ht 5' 8 (1.727 m)   Wt 151 lb (68.5 kg)   SpO2 98%   BMI 22.96 kg/m    Physical Exam: Constitutional: Well appearing male, NAD Abdomen: Soft, non-tender, non-distended, no rebound/guarding Skin: Laparoscopic incisions are healing well, no erythema or drainage, healing ecchymosis   Assessment/Plan: This is a 83 y.o. male 9 days s/p robotic assisted laparoscopic cholecystectomy for biliary colic with Dr Marinda   - Reviewed ED visit and work which was reassuring without any evidence of complication for cholecystectomy. He is doing much better at this time.   - Reviewed dietary recommendations after cholecystectomy; he is having no issues  - Pain control prn; OTC medications should be sufficient at this point   - Reviewed wound care recommendation  - Reviewed lifting restrictions; 4 weeks total  - Reviewed surgical pathology; CCC  - He can follow up on as needed basis; He understands to call with questions/concerns  -- Arthea Platt, PA-C [facility] 10/04/2024, 3:45 PM M-F: 7am - 4pm

## 2024-10-11 ENCOUNTER — Encounter: Payer: Self-pay | Admitting: Radiation Oncology

## 2024-10-11 ENCOUNTER — Ambulatory Visit
Admission: RE | Admit: 2024-10-11 | Discharge: 2024-10-11 | Disposition: A | Discharge status: Home / Self Care | Source: Ambulatory Visit | Attending: Radiation Oncology | Admitting: Radiation Oncology

## 2024-10-11 VITALS — BP 149/80 | HR 55 | Temp 96.2°F | Resp 14 | Ht 68.0 in | Wt 154.5 lb

## 2024-10-11 DIAGNOSIS — C61 Malignant neoplasm of prostate: Secondary | ICD-10-CM | POA: Insufficient documentation

## 2024-10-11 DIAGNOSIS — Z923 Personal history of irradiation: Secondary | ICD-10-CM | POA: Insufficient documentation

## 2024-10-11 NOTE — Progress Notes (Signed)
 Radiation Oncology Follow up Note  Name: Brent Kim   Date:   10/11/2024 MRN:  [id number] DOB: 1941-02-23    This 83 y.o. male presents to the clinic today for 26-month follow-up status post image guided IMRT radiation therapy for Gleason 8 (4+4) adenocarcinoma the prostate presenting with a PSA in the 6 range.  REFERRING PROVIDER: Valora Lynwood FALCON, MD  HPI: Patient is a 83 year old male now out 13 months having completed image guided IMRT radiation therapy for Gleason 8 adenocarcinoma the prostate.  Seen today in routine follow-up he is doing well.  Specifically denies any increased lower urinary tract symptoms diarrhea or fatigue his most recent PSA is 0.07 showing excellent biochemical control of his disease.SABRA  He has recently had a cholecystectomy recently a CT scan of abdomen chest and pelvis for epigastric pain which I have reviewed showing no acute findings.  COMPLICATIONS OF TREATMENT: none  FOLLOW UP COMPLIANCE: keeps appointments   PHYSICAL EXAM:  BP (!) 149/80   Pulse (!) 55   Temp (!) 96.2 F (35.7 C) (Tympanic)   Resp 14   Ht 5' 8 (1.727 m)   Wt 154 lb 8 oz (70.1 kg)   BMI 23.49 kg/m  Well-developed well-nourished patient in NAD. HEENT reveals PERLA, EOMI, discs not visualized.  Oral cavity is clear. No oral mucosal lesions are identified. Neck is clear without evidence of cervical or supraclavicular adenopathy. Lungs are clear to A&P. Cardiac examination is essentially unremarkable with regular rate and rhythm without murmur rub or thrill. Abdomen is benign with no organomegaly or masses noted. Motor sensory and DTR levels are equal and symmetric in the upper and lower extremities. Cranial nerves II through XII are grossly intact. Proprioception is intact. No peripheral adenopathy or edema is identified. No motor or sensory levels are noted. Crude visual fields are within normal range.  RADIOLOGY RESULTS: CT scans reviewed compatible with above-stated  findings  PLAN: Present time patient is doing well under excellent biochemical control of his prostate cancer.  I am pleased with his overall progress.  I have asked to see him back in 6 months with a repeat PSA.  Patient knows to call at anytime with any concerns.  I would like to take this opportunity to thank you for allowing me to participate in the care of your patient.SABRA Brent Penton, MD

## 2025-04-02 ENCOUNTER — Other Ambulatory Visit

## 2025-04-04 ENCOUNTER — Ambulatory Visit: Admitting: Urology

## 2025-04-11 ENCOUNTER — Inpatient Hospital Stay

## 2025-04-18 ENCOUNTER — Ambulatory Visit: Admitting: Radiation Oncology
